# Patient Record
Sex: Female | Born: 1955 | Race: White | Hispanic: No | Smoking: Never smoker
Health system: Southern US, Community
[De-identification: ages and names within clinical notes are randomized; demographics above are authoritative.]

## PROBLEM LIST (undated history)

## (undated) DIAGNOSIS — F419 Anxiety disorder, unspecified: Secondary | ICD-10-CM

## (undated) DIAGNOSIS — N183 Chronic kidney disease, stage 3 unspecified: Secondary | ICD-10-CM

## (undated) DIAGNOSIS — N3281 Overactive bladder: Secondary | ICD-10-CM

## (undated) DIAGNOSIS — E079 Disorder of thyroid, unspecified: Secondary | ICD-10-CM

## (undated) DIAGNOSIS — Z8679 Personal history of other diseases of the circulatory system: Secondary | ICD-10-CM

## (undated) DIAGNOSIS — Z8639 Personal history of other endocrine, nutritional and metabolic disease: Secondary | ICD-10-CM

## (undated) DIAGNOSIS — K219 Gastro-esophageal reflux disease without esophagitis: Secondary | ICD-10-CM

## (undated) DIAGNOSIS — R0602 Shortness of breath: Secondary | ICD-10-CM

## (undated) DIAGNOSIS — Z8669 Personal history of other diseases of the nervous system and sense organs: Secondary | ICD-10-CM

## (undated) DIAGNOSIS — M810 Age-related osteoporosis without current pathological fracture: Secondary | ICD-10-CM

## (undated) DIAGNOSIS — M5030 Other cervical disc degeneration, unspecified cervical region: Secondary | ICD-10-CM

## (undated) DIAGNOSIS — I10 Essential (primary) hypertension: Secondary | ICD-10-CM

## (undated) DIAGNOSIS — M1990 Unspecified osteoarthritis, unspecified site: Secondary | ICD-10-CM

## (undated) DIAGNOSIS — F32A Depression, unspecified: Secondary | ICD-10-CM

## (undated) HISTORY — DX: Shortness of breath: R06.02

## (undated) HISTORY — DX: Anxiety disorder, unspecified: F41.9

## (undated) HISTORY — PX: BOWEL RESECTION: SHX1257

## (undated) HISTORY — PX: SALPINGOOPHORECTOMY: SHX82

## (undated) HISTORY — PX: OTHER SURGICAL HISTORY: SHX169

## (undated) HISTORY — PX: INDUCED ABORTION: SHX677

## (undated) HISTORY — DX: Unspecified osteoarthritis, unspecified site: M19.90

## (undated) HISTORY — PX: WISDOM TOOTH EXTRACTION: SHX21

## (undated) HISTORY — DX: Essential (primary) hypertension: I10

## (undated) HISTORY — DX: Gastro-esophageal reflux disease without esophagitis: K21.9

## (undated) HISTORY — DX: Personal history of other diseases of the nervous system and sense organs: Z86.69

## (undated) HISTORY — PX: TOTAL SHOULDER REPLACEMENT: SUR1217

## (undated) HISTORY — PX: REPLACEMENT TOTAL KNEE: SUR1224

## (undated) HISTORY — DX: Personal history of other endocrine, nutritional and metabolic disease: Z86.39

## (undated) HISTORY — PX: BACK SURGERY: SHX140

## (undated) HISTORY — DX: Age-related osteoporosis without current pathological fracture: M81.0

## (undated) HISTORY — DX: Other cervical disc degeneration, unspecified cervical region: M50.30

## (undated) HISTORY — PX: VAGINAL HYSTERECTOMY: SUR661

## (undated) HISTORY — DX: Chronic kidney disease, stage 3 unspecified: N18.30

## (undated) HISTORY — DX: Personal history of other diseases of the circulatory system: Z86.79

## (undated) HISTORY — PX: CARPAL TUNNEL RELEASE: SHX101

## (undated) HISTORY — DX: Depression, unspecified: F32.A

## (undated) HISTORY — DX: Disorder of thyroid, unspecified: E07.9

## (undated) HISTORY — PX: APPENDECTOMY: SHX54

## (undated) HISTORY — DX: Overactive bladder: N32.81

## (undated) HISTORY — PX: GASTRIC BYPASS: SHX52

---

## 2012-12-14 DIAGNOSIS — E039 Hypothyroidism, unspecified: Secondary | ICD-10-CM | POA: Insufficient documentation

## 2012-12-14 DIAGNOSIS — G4700 Insomnia, unspecified: Secondary | ICD-10-CM | POA: Insufficient documentation

## 2012-12-14 DIAGNOSIS — F32A Depression, unspecified: Secondary | ICD-10-CM | POA: Insufficient documentation

## 2012-12-14 DIAGNOSIS — D508 Other iron deficiency anemias: Secondary | ICD-10-CM | POA: Insufficient documentation

## 2012-12-14 DIAGNOSIS — I10 Essential (primary) hypertension: Secondary | ICD-10-CM | POA: Insufficient documentation

## 2012-12-14 DIAGNOSIS — F3342 Major depressive disorder, recurrent, in full remission: Secondary | ICD-10-CM | POA: Insufficient documentation

## 2014-02-11 DIAGNOSIS — F4310 Post-traumatic stress disorder, unspecified: Secondary | ICD-10-CM | POA: Insufficient documentation

## 2014-04-15 DIAGNOSIS — M1990 Unspecified osteoarthritis, unspecified site: Secondary | ICD-10-CM | POA: Insufficient documentation

## 2014-04-15 DIAGNOSIS — M810 Age-related osteoporosis without current pathological fracture: Secondary | ICD-10-CM | POA: Insufficient documentation

## 2015-04-20 DIAGNOSIS — M5136 Other intervertebral disc degeneration, lumbar region: Secondary | ICD-10-CM | POA: Insufficient documentation

## 2015-06-17 DIAGNOSIS — I10 Essential (primary) hypertension: Secondary | ICD-10-CM | POA: Diagnosis not present

## 2015-06-17 DIAGNOSIS — E039 Hypothyroidism, unspecified: Secondary | ICD-10-CM | POA: Diagnosis not present

## 2015-06-28 DIAGNOSIS — F419 Anxiety disorder, unspecified: Secondary | ICD-10-CM | POA: Diagnosis not present

## 2015-06-28 DIAGNOSIS — R1031 Right lower quadrant pain: Secondary | ICD-10-CM | POA: Diagnosis not present

## 2015-06-28 DIAGNOSIS — K219 Gastro-esophageal reflux disease without esophagitis: Secondary | ICD-10-CM | POA: Diagnosis not present

## 2015-06-28 DIAGNOSIS — F329 Major depressive disorder, single episode, unspecified: Secondary | ICD-10-CM | POA: Diagnosis not present

## 2015-06-28 DIAGNOSIS — I10 Essential (primary) hypertension: Secondary | ICD-10-CM | POA: Diagnosis not present

## 2015-06-28 DIAGNOSIS — R1033 Periumbilical pain: Secondary | ICD-10-CM | POA: Diagnosis not present

## 2015-06-28 DIAGNOSIS — K316 Fistula of stomach and duodenum: Secondary | ICD-10-CM | POA: Diagnosis not present

## 2015-06-28 DIAGNOSIS — Z79899 Other long term (current) drug therapy: Secondary | ICD-10-CM | POA: Diagnosis not present

## 2015-06-28 DIAGNOSIS — R1011 Right upper quadrant pain: Secondary | ICD-10-CM | POA: Diagnosis not present

## 2015-06-28 DIAGNOSIS — R1030 Lower abdominal pain, unspecified: Secondary | ICD-10-CM | POA: Diagnosis not present

## 2015-06-28 DIAGNOSIS — G43909 Migraine, unspecified, not intractable, without status migrainosus: Secondary | ICD-10-CM | POA: Diagnosis not present

## 2015-06-28 DIAGNOSIS — M1990 Unspecified osteoarthritis, unspecified site: Secondary | ICD-10-CM | POA: Diagnosis not present

## 2015-06-29 DIAGNOSIS — R1033 Periumbilical pain: Secondary | ICD-10-CM | POA: Diagnosis not present

## 2015-06-29 DIAGNOSIS — R1031 Right lower quadrant pain: Secondary | ICD-10-CM | POA: Diagnosis not present

## 2015-07-06 DIAGNOSIS — R109 Unspecified abdominal pain: Secondary | ICD-10-CM | POA: Diagnosis not present

## 2015-07-06 DIAGNOSIS — Z9889 Other specified postprocedural states: Secondary | ICD-10-CM | POA: Diagnosis not present

## 2015-07-06 DIAGNOSIS — Z6835 Body mass index (BMI) 35.0-35.9, adult: Secondary | ICD-10-CM | POA: Diagnosis not present

## 2015-07-06 DIAGNOSIS — K316 Fistula of stomach and duodenum: Secondary | ICD-10-CM | POA: Diagnosis not present

## 2015-07-21 DIAGNOSIS — R05 Cough: Secondary | ICD-10-CM | POA: Diagnosis not present

## 2015-07-21 DIAGNOSIS — J329 Chronic sinusitis, unspecified: Secondary | ICD-10-CM | POA: Diagnosis not present

## 2015-07-21 DIAGNOSIS — J029 Acute pharyngitis, unspecified: Secondary | ICD-10-CM | POA: Diagnosis not present

## 2015-07-27 DIAGNOSIS — K9189 Other postprocedural complications and disorders of digestive system: Secondary | ICD-10-CM | POA: Diagnosis not present

## 2015-07-27 DIAGNOSIS — Y832 Surgical operation with anastomosis, bypass or graft as the cause of abnormal reaction of the patient, or of later complication, without mention of misadventure at the time of the procedure: Secondary | ICD-10-CM | POA: Diagnosis not present

## 2015-07-29 ENCOUNTER — Other Ambulatory Visit: Payer: Self-pay | Admitting: Surgery

## 2015-07-29 DIAGNOSIS — K316 Fistula of stomach and duodenum: Secondary | ICD-10-CM

## 2015-08-02 ENCOUNTER — Other Ambulatory Visit: Payer: Self-pay

## 2015-08-04 ENCOUNTER — Other Ambulatory Visit: Payer: Self-pay

## 2015-08-05 ENCOUNTER — Ambulatory Visit
Admission: RE | Admit: 2015-08-05 | Discharge: 2015-08-05 | Disposition: A | Discharge status: Home / Self Care | Payer: Medicare Other | Source: Ambulatory Visit | Attending: Surgery | Admitting: Surgery

## 2015-08-05 ENCOUNTER — Other Ambulatory Visit: Payer: Self-pay | Admitting: Surgery

## 2015-08-05 DIAGNOSIS — K316 Fistula of stomach and duodenum: Secondary | ICD-10-CM

## 2015-08-05 DIAGNOSIS — K449 Diaphragmatic hernia without obstruction or gangrene: Secondary | ICD-10-CM | POA: Diagnosis not present

## 2015-08-17 DIAGNOSIS — E039 Hypothyroidism, unspecified: Secondary | ICD-10-CM | POA: Diagnosis not present

## 2015-08-17 DIAGNOSIS — I10 Essential (primary) hypertension: Secondary | ICD-10-CM | POA: Diagnosis not present

## 2015-08-24 DIAGNOSIS — R7309 Other abnormal glucose: Secondary | ICD-10-CM | POA: Diagnosis not present

## 2015-08-30 DIAGNOSIS — I10 Essential (primary) hypertension: Secondary | ICD-10-CM | POA: Diagnosis not present

## 2015-08-30 DIAGNOSIS — M25572 Pain in left ankle and joints of left foot: Secondary | ICD-10-CM | POA: Diagnosis not present

## 2015-08-30 DIAGNOSIS — Z139 Encounter for screening, unspecified: Secondary | ICD-10-CM | POA: Diagnosis not present

## 2015-08-30 DIAGNOSIS — Z1389 Encounter for screening for other disorder: Secondary | ICD-10-CM | POA: Diagnosis not present

## 2015-08-30 DIAGNOSIS — E039 Hypothyroidism, unspecified: Secondary | ICD-10-CM | POA: Diagnosis not present

## 2015-08-30 DIAGNOSIS — M25579 Pain in unspecified ankle and joints of unspecified foot: Secondary | ICD-10-CM | POA: Diagnosis not present

## 2015-08-30 DIAGNOSIS — K449 Diaphragmatic hernia without obstruction or gangrene: Secondary | ICD-10-CM | POA: Diagnosis not present

## 2015-08-30 DIAGNOSIS — Z0000 Encounter for general adult medical examination without abnormal findings: Secondary | ICD-10-CM | POA: Diagnosis not present

## 2015-09-07 DIAGNOSIS — K449 Diaphragmatic hernia without obstruction or gangrene: Secondary | ICD-10-CM | POA: Diagnosis not present

## 2015-09-07 DIAGNOSIS — R1084 Generalized abdominal pain: Secondary | ICD-10-CM | POA: Diagnosis not present

## 2015-10-13 DIAGNOSIS — M25572 Pain in left ankle and joints of left foot: Secondary | ICD-10-CM | POA: Diagnosis not present

## 2015-10-13 DIAGNOSIS — R262 Difficulty in walking, not elsewhere classified: Secondary | ICD-10-CM | POA: Diagnosis not present

## 2015-10-13 DIAGNOSIS — M6281 Muscle weakness (generalized): Secondary | ICD-10-CM | POA: Diagnosis not present

## 2015-10-13 DIAGNOSIS — I10 Essential (primary) hypertension: Secondary | ICD-10-CM | POA: Diagnosis not present

## 2016-01-20 DIAGNOSIS — M79672 Pain in left foot: Secondary | ICD-10-CM | POA: Diagnosis not present

## 2016-02-16 DIAGNOSIS — G8929 Other chronic pain: Secondary | ICD-10-CM | POA: Diagnosis not present

## 2016-02-16 DIAGNOSIS — Z1389 Encounter for screening for other disorder: Secondary | ICD-10-CM | POA: Diagnosis not present

## 2016-02-16 DIAGNOSIS — Z13220 Encounter for screening for lipoid disorders: Secondary | ICD-10-CM | POA: Diagnosis not present

## 2016-02-16 DIAGNOSIS — M549 Dorsalgia, unspecified: Secondary | ICD-10-CM | POA: Diagnosis not present

## 2016-02-16 DIAGNOSIS — E039 Hypothyroidism, unspecified: Secondary | ICD-10-CM | POA: Diagnosis not present

## 2016-02-16 DIAGNOSIS — I10 Essential (primary) hypertension: Secondary | ICD-10-CM | POA: Diagnosis not present

## 2016-02-16 DIAGNOSIS — R7301 Impaired fasting glucose: Secondary | ICD-10-CM | POA: Diagnosis not present

## 2016-03-21 DIAGNOSIS — M25571 Pain in right ankle and joints of right foot: Secondary | ICD-10-CM | POA: Diagnosis not present

## 2016-04-17 DIAGNOSIS — M1288 Other specific arthropathies, not elsewhere classified, other specified site: Secondary | ICD-10-CM | POA: Diagnosis not present

## 2016-04-17 DIAGNOSIS — M4316 Spondylolisthesis, lumbar region: Secondary | ICD-10-CM | POA: Diagnosis not present

## 2016-04-17 DIAGNOSIS — M5136 Other intervertebral disc degeneration, lumbar region: Secondary | ICD-10-CM | POA: Diagnosis not present

## 2016-04-17 DIAGNOSIS — M5416 Radiculopathy, lumbar region: Secondary | ICD-10-CM | POA: Diagnosis not present

## 2016-04-24 DIAGNOSIS — M545 Low back pain: Secondary | ICD-10-CM | POA: Diagnosis not present

## 2016-04-24 DIAGNOSIS — M4316 Spondylolisthesis, lumbar region: Secondary | ICD-10-CM | POA: Diagnosis not present

## 2016-04-24 DIAGNOSIS — M5416 Radiculopathy, lumbar region: Secondary | ICD-10-CM | POA: Diagnosis not present

## 2016-04-24 DIAGNOSIS — M5126 Other intervertebral disc displacement, lumbar region: Secondary | ICD-10-CM | POA: Diagnosis not present

## 2016-04-24 DIAGNOSIS — M5136 Other intervertebral disc degeneration, lumbar region: Secondary | ICD-10-CM | POA: Diagnosis not present

## 2016-05-01 DIAGNOSIS — M5416 Radiculopathy, lumbar region: Secondary | ICD-10-CM | POA: Diagnosis not present

## 2016-05-01 DIAGNOSIS — M5136 Other intervertebral disc degeneration, lumbar region: Secondary | ICD-10-CM | POA: Diagnosis not present

## 2016-05-01 DIAGNOSIS — M9983 Other biomechanical lesions of lumbar region: Secondary | ICD-10-CM | POA: Diagnosis not present

## 2016-05-01 DIAGNOSIS — M1288 Other specific arthropathies, not elsewhere classified, other specified site: Secondary | ICD-10-CM | POA: Diagnosis not present

## 2016-05-22 DIAGNOSIS — R292 Abnormal reflex: Secondary | ICD-10-CM | POA: Diagnosis not present

## 2016-05-22 DIAGNOSIS — M545 Low back pain: Secondary | ICD-10-CM | POA: Diagnosis not present

## 2016-05-22 DIAGNOSIS — M47816 Spondylosis without myelopathy or radiculopathy, lumbar region: Secondary | ICD-10-CM | POA: Diagnosis not present

## 2016-05-22 DIAGNOSIS — G8929 Other chronic pain: Secondary | ICD-10-CM | POA: Diagnosis not present

## 2016-05-29 DIAGNOSIS — M50221 Other cervical disc displacement at C4-C5 level: Secondary | ICD-10-CM | POA: Diagnosis not present

## 2016-05-29 DIAGNOSIS — M50222 Other cervical disc displacement at C5-C6 level: Secondary | ICD-10-CM | POA: Diagnosis not present

## 2016-05-29 DIAGNOSIS — M50223 Other cervical disc displacement at C6-C7 level: Secondary | ICD-10-CM | POA: Diagnosis not present

## 2016-05-29 DIAGNOSIS — M5030 Other cervical disc degeneration, unspecified cervical region: Secondary | ICD-10-CM | POA: Diagnosis not present

## 2016-06-05 DIAGNOSIS — M545 Low back pain: Secondary | ICD-10-CM | POA: Diagnosis not present

## 2016-06-05 DIAGNOSIS — M9983 Other biomechanical lesions of lumbar region: Secondary | ICD-10-CM | POA: Diagnosis not present

## 2016-06-05 DIAGNOSIS — G8929 Other chronic pain: Secondary | ICD-10-CM | POA: Diagnosis not present

## 2016-06-05 DIAGNOSIS — M5136 Other intervertebral disc degeneration, lumbar region: Secondary | ICD-10-CM | POA: Diagnosis not present

## 2016-06-05 DIAGNOSIS — M5416 Radiculopathy, lumbar region: Secondary | ICD-10-CM | POA: Diagnosis not present

## 2016-06-19 DIAGNOSIS — G8929 Other chronic pain: Secondary | ICD-10-CM | POA: Diagnosis not present

## 2016-06-19 DIAGNOSIS — M549 Dorsalgia, unspecified: Secondary | ICD-10-CM | POA: Diagnosis not present

## 2016-06-19 DIAGNOSIS — M1288 Other specific arthropathies, not elsewhere classified, other specified site: Secondary | ICD-10-CM | POA: Diagnosis not present

## 2016-07-11 DIAGNOSIS — M4316 Spondylolisthesis, lumbar region: Secondary | ICD-10-CM | POA: Diagnosis not present

## 2016-08-22 DIAGNOSIS — M170 Bilateral primary osteoarthritis of knee: Secondary | ICD-10-CM | POA: Diagnosis not present

## 2016-08-30 DIAGNOSIS — G894 Chronic pain syndrome: Secondary | ICD-10-CM | POA: Diagnosis not present

## 2016-08-30 DIAGNOSIS — M179 Osteoarthritis of knee, unspecified: Secondary | ICD-10-CM | POA: Diagnosis not present

## 2016-08-30 DIAGNOSIS — G8929 Other chronic pain: Secondary | ICD-10-CM | POA: Diagnosis not present

## 2016-08-30 DIAGNOSIS — M5416 Radiculopathy, lumbar region: Secondary | ICD-10-CM | POA: Diagnosis not present

## 2016-08-30 DIAGNOSIS — M5440 Lumbago with sciatica, unspecified side: Secondary | ICD-10-CM | POA: Diagnosis not present

## 2016-09-04 DIAGNOSIS — M170 Bilateral primary osteoarthritis of knee: Secondary | ICD-10-CM | POA: Diagnosis not present

## 2016-09-14 DIAGNOSIS — E039 Hypothyroidism, unspecified: Secondary | ICD-10-CM | POA: Diagnosis not present

## 2016-09-14 DIAGNOSIS — Z13220 Encounter for screening for lipoid disorders: Secondary | ICD-10-CM | POA: Diagnosis not present

## 2016-09-14 DIAGNOSIS — Z0000 Encounter for general adult medical examination without abnormal findings: Secondary | ICD-10-CM | POA: Diagnosis not present

## 2016-09-14 DIAGNOSIS — R7303 Prediabetes: Secondary | ICD-10-CM | POA: Diagnosis not present

## 2016-09-14 DIAGNOSIS — Z139 Encounter for screening, unspecified: Secondary | ICD-10-CM | POA: Diagnosis not present

## 2016-09-14 DIAGNOSIS — Z1389 Encounter for screening for other disorder: Secondary | ICD-10-CM | POA: Diagnosis not present

## 2016-09-14 DIAGNOSIS — I10 Essential (primary) hypertension: Secondary | ICD-10-CM | POA: Diagnosis not present

## 2016-09-14 DIAGNOSIS — E538 Deficiency of other specified B group vitamins: Secondary | ICD-10-CM | POA: Diagnosis not present

## 2016-09-27 DIAGNOSIS — M5440 Lumbago with sciatica, unspecified side: Secondary | ICD-10-CM | POA: Diagnosis not present

## 2016-09-27 DIAGNOSIS — G8929 Other chronic pain: Secondary | ICD-10-CM | POA: Diagnosis not present

## 2016-09-27 DIAGNOSIS — G894 Chronic pain syndrome: Secondary | ICD-10-CM | POA: Diagnosis not present

## 2016-09-27 DIAGNOSIS — R1031 Right lower quadrant pain: Secondary | ICD-10-CM | POA: Diagnosis not present

## 2016-09-27 DIAGNOSIS — R109 Unspecified abdominal pain: Secondary | ICD-10-CM | POA: Diagnosis not present

## 2016-09-27 DIAGNOSIS — M5416 Radiculopathy, lumbar region: Secondary | ICD-10-CM | POA: Diagnosis not present

## 2016-09-27 DIAGNOSIS — M179 Osteoarthritis of knee, unspecified: Secondary | ICD-10-CM | POA: Diagnosis not present

## 2016-09-28 DIAGNOSIS — R109 Unspecified abdominal pain: Secondary | ICD-10-CM | POA: Diagnosis not present

## 2016-10-02 DIAGNOSIS — Z9189 Other specified personal risk factors, not elsewhere classified: Secondary | ICD-10-CM | POA: Diagnosis not present

## 2016-10-09 DIAGNOSIS — M810 Age-related osteoporosis without current pathological fracture: Secondary | ICD-10-CM | POA: Diagnosis not present

## 2016-10-09 DIAGNOSIS — Z1231 Encounter for screening mammogram for malignant neoplasm of breast: Secondary | ICD-10-CM | POA: Diagnosis not present

## 2016-10-18 DIAGNOSIS — M545 Low back pain: Secondary | ICD-10-CM | POA: Diagnosis not present

## 2016-10-18 DIAGNOSIS — M791 Myalgia: Secondary | ICD-10-CM | POA: Diagnosis not present

## 2016-10-18 DIAGNOSIS — M179 Osteoarthritis of knee, unspecified: Secondary | ICD-10-CM | POA: Diagnosis not present

## 2016-10-18 DIAGNOSIS — G8929 Other chronic pain: Secondary | ICD-10-CM | POA: Diagnosis not present

## 2016-10-18 DIAGNOSIS — M5440 Lumbago with sciatica, unspecified side: Secondary | ICD-10-CM | POA: Diagnosis not present

## 2016-10-18 DIAGNOSIS — M5416 Radiculopathy, lumbar region: Secondary | ICD-10-CM | POA: Diagnosis not present

## 2016-10-18 DIAGNOSIS — G894 Chronic pain syndrome: Secondary | ICD-10-CM | POA: Diagnosis not present

## 2016-10-23 DIAGNOSIS — M170 Bilateral primary osteoarthritis of knee: Secondary | ICD-10-CM | POA: Diagnosis not present

## 2016-10-25 DIAGNOSIS — M5440 Lumbago with sciatica, unspecified side: Secondary | ICD-10-CM | POA: Diagnosis not present

## 2016-10-25 DIAGNOSIS — M179 Osteoarthritis of knee, unspecified: Secondary | ICD-10-CM | POA: Diagnosis not present

## 2016-10-25 DIAGNOSIS — M545 Low back pain: Secondary | ICD-10-CM | POA: Diagnosis not present

## 2016-10-25 DIAGNOSIS — M5416 Radiculopathy, lumbar region: Secondary | ICD-10-CM | POA: Diagnosis not present

## 2016-10-25 DIAGNOSIS — G8929 Other chronic pain: Secondary | ICD-10-CM | POA: Diagnosis not present

## 2016-11-01 DIAGNOSIS — M5416 Radiculopathy, lumbar region: Secondary | ICD-10-CM | POA: Diagnosis not present

## 2016-11-01 DIAGNOSIS — M179 Osteoarthritis of knee, unspecified: Secondary | ICD-10-CM | POA: Diagnosis not present

## 2016-11-01 DIAGNOSIS — G894 Chronic pain syndrome: Secondary | ICD-10-CM | POA: Diagnosis not present

## 2016-11-01 DIAGNOSIS — G8929 Other chronic pain: Secondary | ICD-10-CM | POA: Diagnosis not present

## 2016-11-01 DIAGNOSIS — M5440 Lumbago with sciatica, unspecified side: Secondary | ICD-10-CM | POA: Diagnosis not present

## 2016-11-28 DIAGNOSIS — M810 Age-related osteoporosis without current pathological fracture: Secondary | ICD-10-CM | POA: Diagnosis not present

## 2016-11-28 DIAGNOSIS — Z0000 Encounter for general adult medical examination without abnormal findings: Secondary | ICD-10-CM | POA: Diagnosis not present

## 2016-11-28 DIAGNOSIS — M170 Bilateral primary osteoarthritis of knee: Secondary | ICD-10-CM | POA: Diagnosis not present

## 2016-11-29 DIAGNOSIS — M79609 Pain in unspecified limb: Secondary | ICD-10-CM | POA: Diagnosis not present

## 2016-11-29 DIAGNOSIS — Z01810 Encounter for preprocedural cardiovascular examination: Secondary | ICD-10-CM | POA: Diagnosis not present

## 2016-11-29 DIAGNOSIS — Z01812 Encounter for preprocedural laboratory examination: Secondary | ICD-10-CM | POA: Diagnosis not present

## 2016-11-29 DIAGNOSIS — Z79899 Other long term (current) drug therapy: Secondary | ICD-10-CM | POA: Diagnosis not present

## 2016-11-29 DIAGNOSIS — R52 Pain, unspecified: Secondary | ICD-10-CM | POA: Diagnosis not present

## 2016-11-29 DIAGNOSIS — Z01818 Encounter for other preprocedural examination: Secondary | ICD-10-CM | POA: Diagnosis not present

## 2016-12-18 DIAGNOSIS — S8392XA Sprain of unspecified site of left knee, initial encounter: Secondary | ICD-10-CM | POA: Diagnosis not present

## 2016-12-18 DIAGNOSIS — N3289 Other specified disorders of bladder: Secondary | ICD-10-CM | POA: Diagnosis not present

## 2016-12-18 DIAGNOSIS — N3281 Overactive bladder: Secondary | ICD-10-CM | POA: Diagnosis not present

## 2016-12-25 DIAGNOSIS — M25572 Pain in left ankle and joints of left foot: Secondary | ICD-10-CM | POA: Diagnosis not present

## 2016-12-25 DIAGNOSIS — S8392XA Sprain of unspecified site of left knee, initial encounter: Secondary | ICD-10-CM | POA: Diagnosis not present

## 2016-12-25 DIAGNOSIS — M25562 Pain in left knee: Secondary | ICD-10-CM | POA: Diagnosis not present

## 2017-01-11 DIAGNOSIS — Z01818 Encounter for other preprocedural examination: Secondary | ICD-10-CM | POA: Diagnosis not present

## 2017-01-11 DIAGNOSIS — Z79899 Other long term (current) drug therapy: Secondary | ICD-10-CM | POA: Diagnosis not present

## 2017-01-11 DIAGNOSIS — R52 Pain, unspecified: Secondary | ICD-10-CM | POA: Diagnosis not present

## 2017-01-11 DIAGNOSIS — M79609 Pain in unspecified limb: Secondary | ICD-10-CM | POA: Diagnosis not present

## 2017-01-15 DIAGNOSIS — G2581 Restless legs syndrome: Secondary | ICD-10-CM | POA: Diagnosis not present

## 2017-01-15 DIAGNOSIS — Z139 Encounter for screening, unspecified: Secondary | ICD-10-CM | POA: Diagnosis not present

## 2017-01-15 DIAGNOSIS — M5416 Radiculopathy, lumbar region: Secondary | ICD-10-CM | POA: Diagnosis not present

## 2017-01-15 DIAGNOSIS — Z1389 Encounter for screening for other disorder: Secondary | ICD-10-CM | POA: Diagnosis not present

## 2017-01-30 DIAGNOSIS — M5442 Lumbago with sciatica, left side: Secondary | ICD-10-CM | POA: Diagnosis not present

## 2017-01-30 DIAGNOSIS — M5441 Lumbago with sciatica, right side: Secondary | ICD-10-CM | POA: Diagnosis not present

## 2017-01-30 DIAGNOSIS — G629 Polyneuropathy, unspecified: Secondary | ICD-10-CM | POA: Diagnosis not present

## 2017-01-30 DIAGNOSIS — M1990 Unspecified osteoarthritis, unspecified site: Secondary | ICD-10-CM | POA: Diagnosis not present

## 2017-02-08 DIAGNOSIS — Z01818 Encounter for other preprocedural examination: Secondary | ICD-10-CM | POA: Diagnosis not present

## 2017-02-08 DIAGNOSIS — M79609 Pain in unspecified limb: Secondary | ICD-10-CM | POA: Diagnosis not present

## 2017-03-06 DIAGNOSIS — Z01818 Encounter for other preprocedural examination: Secondary | ICD-10-CM | POA: Diagnosis not present

## 2017-03-06 DIAGNOSIS — M1711 Unilateral primary osteoarthritis, right knee: Secondary | ICD-10-CM | POA: Diagnosis not present

## 2017-03-07 DIAGNOSIS — M1711 Unilateral primary osteoarthritis, right knee: Secondary | ICD-10-CM | POA: Diagnosis not present

## 2017-03-14 DIAGNOSIS — Z79899 Other long term (current) drug therapy: Secondary | ICD-10-CM | POA: Diagnosis not present

## 2017-03-14 DIAGNOSIS — Z881 Allergy status to other antibiotic agents status: Secondary | ICD-10-CM | POA: Diagnosis not present

## 2017-03-14 DIAGNOSIS — E782 Mixed hyperlipidemia: Secondary | ICD-10-CM | POA: Diagnosis not present

## 2017-03-14 DIAGNOSIS — Z471 Aftercare following joint replacement surgery: Secondary | ICD-10-CM | POA: Diagnosis not present

## 2017-03-14 DIAGNOSIS — G2581 Restless legs syndrome: Secondary | ICD-10-CM | POA: Diagnosis not present

## 2017-03-14 DIAGNOSIS — I509 Heart failure, unspecified: Secondary | ICD-10-CM | POA: Diagnosis not present

## 2017-03-14 DIAGNOSIS — G8918 Other acute postprocedural pain: Secondary | ICD-10-CM | POA: Diagnosis not present

## 2017-03-14 DIAGNOSIS — Z96651 Presence of right artificial knee joint: Secondary | ICD-10-CM | POA: Diagnosis not present

## 2017-03-14 DIAGNOSIS — D509 Iron deficiency anemia, unspecified: Secondary | ICD-10-CM | POA: Diagnosis not present

## 2017-03-14 DIAGNOSIS — N3281 Overactive bladder: Secondary | ICD-10-CM | POA: Diagnosis not present

## 2017-03-14 DIAGNOSIS — I110 Hypertensive heart disease with heart failure: Secondary | ICD-10-CM | POA: Diagnosis not present

## 2017-03-14 DIAGNOSIS — M1711 Unilateral primary osteoarthritis, right knee: Secondary | ICD-10-CM | POA: Diagnosis not present

## 2017-03-14 DIAGNOSIS — K219 Gastro-esophageal reflux disease without esophagitis: Secondary | ICD-10-CM | POA: Diagnosis not present

## 2017-03-17 DIAGNOSIS — Z471 Aftercare following joint replacement surgery: Secondary | ICD-10-CM | POA: Diagnosis not present

## 2017-03-17 DIAGNOSIS — Z79891 Long term (current) use of opiate analgesic: Secondary | ICD-10-CM | POA: Diagnosis not present

## 2017-03-17 DIAGNOSIS — Z96651 Presence of right artificial knee joint: Secondary | ICD-10-CM | POA: Diagnosis not present

## 2017-03-17 DIAGNOSIS — Z7982 Long term (current) use of aspirin: Secondary | ICD-10-CM | POA: Diagnosis not present

## 2017-03-18 DIAGNOSIS — Z7982 Long term (current) use of aspirin: Secondary | ICD-10-CM | POA: Diagnosis not present

## 2017-03-18 DIAGNOSIS — Z471 Aftercare following joint replacement surgery: Secondary | ICD-10-CM | POA: Diagnosis not present

## 2017-03-18 DIAGNOSIS — Z79891 Long term (current) use of opiate analgesic: Secondary | ICD-10-CM | POA: Diagnosis not present

## 2017-03-18 DIAGNOSIS — Z96651 Presence of right artificial knee joint: Secondary | ICD-10-CM | POA: Diagnosis not present

## 2017-03-19 DIAGNOSIS — Z96651 Presence of right artificial knee joint: Secondary | ICD-10-CM | POA: Diagnosis not present

## 2017-03-19 DIAGNOSIS — Z471 Aftercare following joint replacement surgery: Secondary | ICD-10-CM | POA: Diagnosis not present

## 2017-03-19 DIAGNOSIS — Z79891 Long term (current) use of opiate analgesic: Secondary | ICD-10-CM | POA: Diagnosis not present

## 2017-03-19 DIAGNOSIS — Z7982 Long term (current) use of aspirin: Secondary | ICD-10-CM | POA: Diagnosis not present

## 2017-03-20 DIAGNOSIS — Z79891 Long term (current) use of opiate analgesic: Secondary | ICD-10-CM | POA: Diagnosis not present

## 2017-03-20 DIAGNOSIS — Z7982 Long term (current) use of aspirin: Secondary | ICD-10-CM | POA: Diagnosis not present

## 2017-03-20 DIAGNOSIS — Z96651 Presence of right artificial knee joint: Secondary | ICD-10-CM | POA: Diagnosis not present

## 2017-03-20 DIAGNOSIS — Z471 Aftercare following joint replacement surgery: Secondary | ICD-10-CM | POA: Diagnosis not present

## 2017-03-21 DIAGNOSIS — Z79891 Long term (current) use of opiate analgesic: Secondary | ICD-10-CM | POA: Diagnosis not present

## 2017-03-21 DIAGNOSIS — Z96651 Presence of right artificial knee joint: Secondary | ICD-10-CM | POA: Diagnosis not present

## 2017-03-21 DIAGNOSIS — Z7982 Long term (current) use of aspirin: Secondary | ICD-10-CM | POA: Diagnosis not present

## 2017-03-21 DIAGNOSIS — Z471 Aftercare following joint replacement surgery: Secondary | ICD-10-CM | POA: Diagnosis not present

## 2017-03-22 DIAGNOSIS — Z79891 Long term (current) use of opiate analgesic: Secondary | ICD-10-CM | POA: Diagnosis not present

## 2017-03-22 DIAGNOSIS — Z471 Aftercare following joint replacement surgery: Secondary | ICD-10-CM | POA: Diagnosis not present

## 2017-03-22 DIAGNOSIS — Z7982 Long term (current) use of aspirin: Secondary | ICD-10-CM | POA: Diagnosis not present

## 2017-03-22 DIAGNOSIS — Z96651 Presence of right artificial knee joint: Secondary | ICD-10-CM | POA: Diagnosis not present

## 2017-03-26 DIAGNOSIS — M7989 Other specified soft tissue disorders: Secondary | ICD-10-CM | POA: Diagnosis not present

## 2017-03-26 DIAGNOSIS — Z7982 Long term (current) use of aspirin: Secondary | ICD-10-CM | POA: Diagnosis not present

## 2017-03-26 DIAGNOSIS — Z471 Aftercare following joint replacement surgery: Secondary | ICD-10-CM | POA: Diagnosis not present

## 2017-03-26 DIAGNOSIS — Z96651 Presence of right artificial knee joint: Secondary | ICD-10-CM | POA: Diagnosis not present

## 2017-03-26 DIAGNOSIS — M79661 Pain in right lower leg: Secondary | ICD-10-CM | POA: Diagnosis not present

## 2017-03-26 DIAGNOSIS — Z79891 Long term (current) use of opiate analgesic: Secondary | ICD-10-CM | POA: Diagnosis not present

## 2017-03-29 DIAGNOSIS — R2689 Other abnormalities of gait and mobility: Secondary | ICD-10-CM | POA: Diagnosis not present

## 2017-03-29 DIAGNOSIS — M6281 Muscle weakness (generalized): Secondary | ICD-10-CM | POA: Diagnosis not present

## 2017-03-29 DIAGNOSIS — M1711 Unilateral primary osteoarthritis, right knee: Secondary | ICD-10-CM | POA: Diagnosis not present

## 2017-03-29 DIAGNOSIS — M25561 Pain in right knee: Secondary | ICD-10-CM | POA: Diagnosis not present

## 2017-03-29 DIAGNOSIS — M25661 Stiffness of right knee, not elsewhere classified: Secondary | ICD-10-CM | POA: Diagnosis not present

## 2017-04-24 DIAGNOSIS — Z96651 Presence of right artificial knee joint: Secondary | ICD-10-CM | POA: Diagnosis not present

## 2017-05-22 DIAGNOSIS — M1990 Unspecified osteoarthritis, unspecified site: Secondary | ICD-10-CM | POA: Diagnosis not present

## 2017-05-22 DIAGNOSIS — G629 Polyneuropathy, unspecified: Secondary | ICD-10-CM | POA: Diagnosis not present

## 2017-06-15 IMAGING — RF DG UGI W/ KUB
19 of 24 series · 19 of 24 positions shown · non-contrast
Comparison: Abdomen CT dated 06/29/2015.

CLINICAL DATA: Oral contrast thought to be within the excluded
portion of the stomach on a recent abdomen CT. This was felt to be
suspicious for a gastrogastric fistula. Status post gastric bypass
in 9990. The patient reports that she had a large hiatal hernia
prior to the bypass.

EXAM:
UPPER GI SERIES WITH KUB
TECHNIQUE: After obtaining a scout radiograph a routine upper GI series was
performed using thin density barium.
FLUOROSCOPY TIME:  Radiation Exposure Index (as provided by the
fluoroscopic device): 169 dGy/cm2
If the device does not provide the exposure index:
Fluoroscopy Time (in minutes and seconds):  3 minutes and 54 seconds
Number of Acquired Images:  28

[Series 1: run · 1 of 1 slices shown (1 of 19)]
[im 1/1]
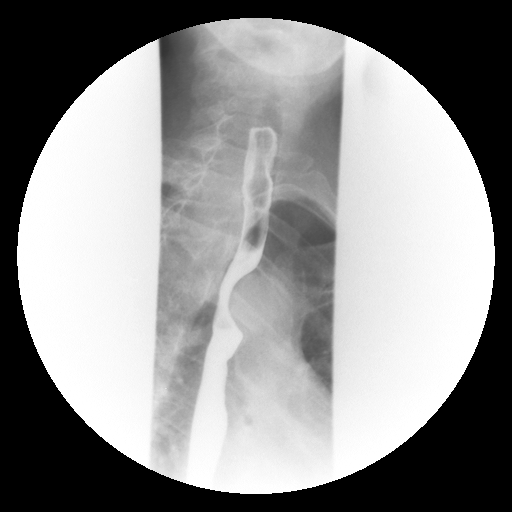

[Series 2: run · 1 of 1 slices shown (2 of 19)]
[im 1/1]
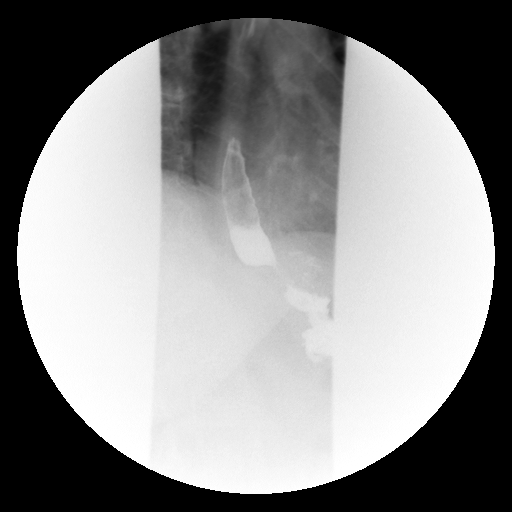

[Series 4: run · 1 of 1 slices shown (3 of 19)]
[im 1/1]
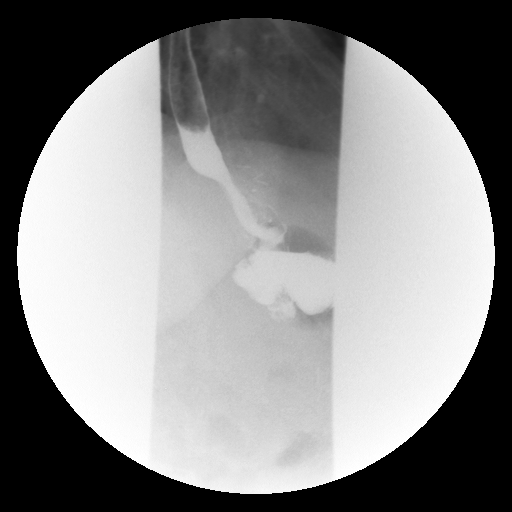

[Series 5: run · 1 of 1 slices shown (4 of 19)]
[im 1/1]
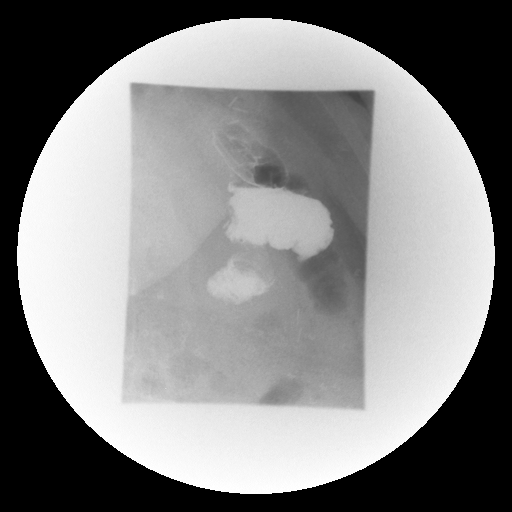

[Series 6: run · 1 of 1 slices shown (5 of 19)]
[im 1/1]
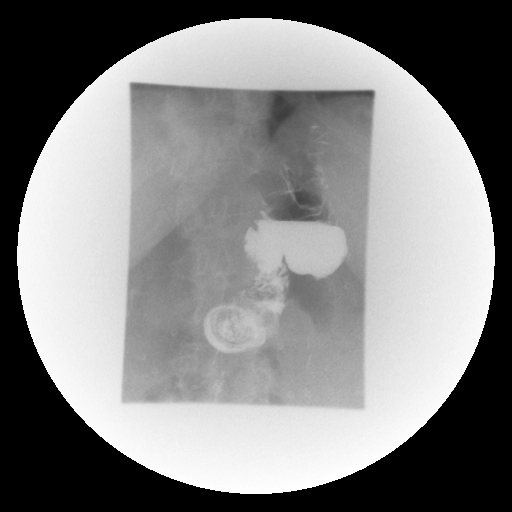

[Series 7: run · 1 of 1 slices shown (6 of 19)]
[im 1/1]
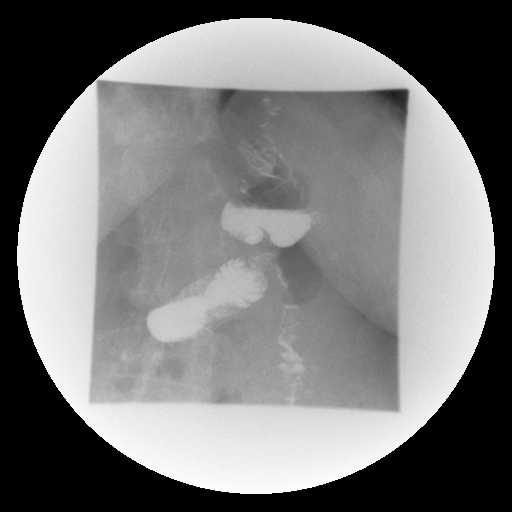

[Series 9: run · 1 of 1 slices shown (7 of 19)]
[im 1/1]
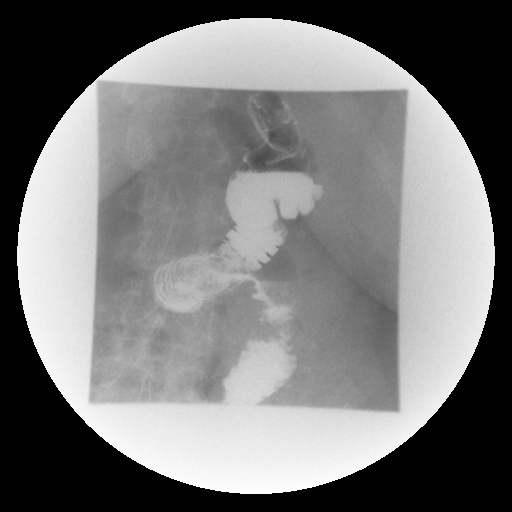

[Series 10: run · 1 of 1 slices shown (8 of 19)]
[im 1/1]
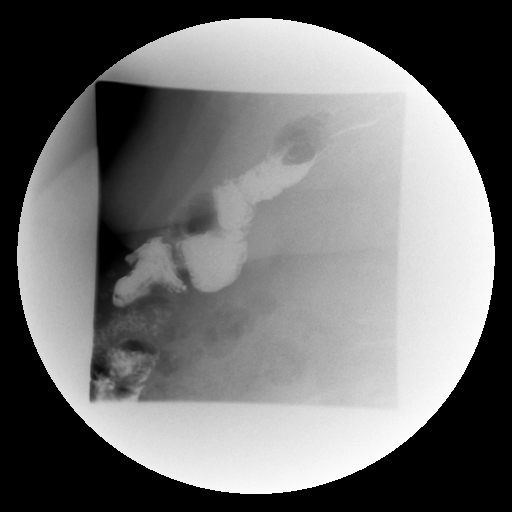

[Series 11: run · 1 of 1 slices shown (9 of 19)]
[im 1/1]
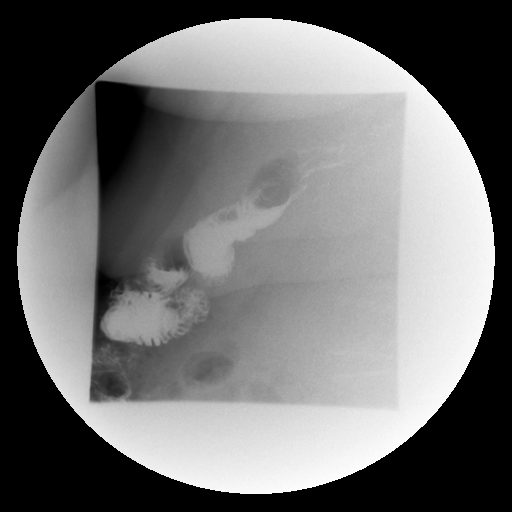

[Series 13: run · 1 of 1 slices shown (10 of 19)]
[im 1/1]
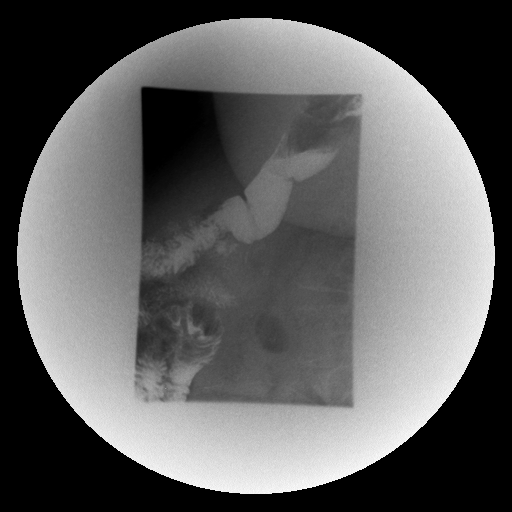

[Series 14: run · 1 of 1 slices shown (11 of 19)]
[im 1/1]
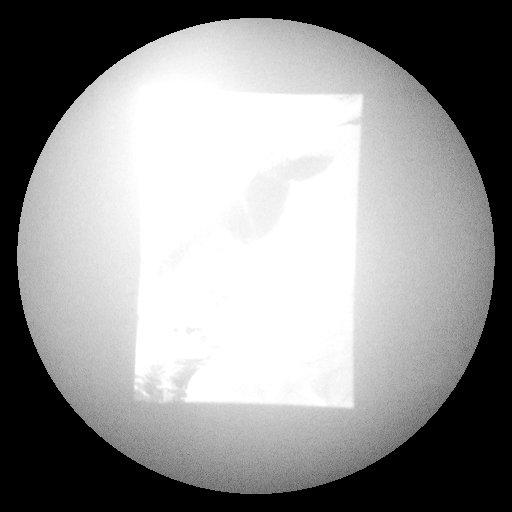

[Series 15: run · 1 of 1 slices shown (12 of 19)]
[im 1/1]
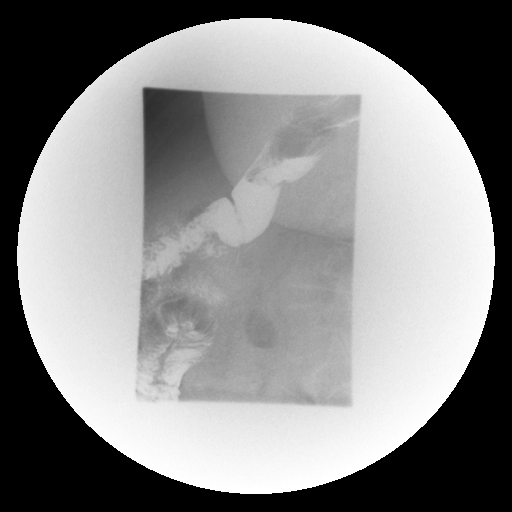

[Series 16: run · 1 of 1 slices shown (13 of 19)]
[im 1/1]
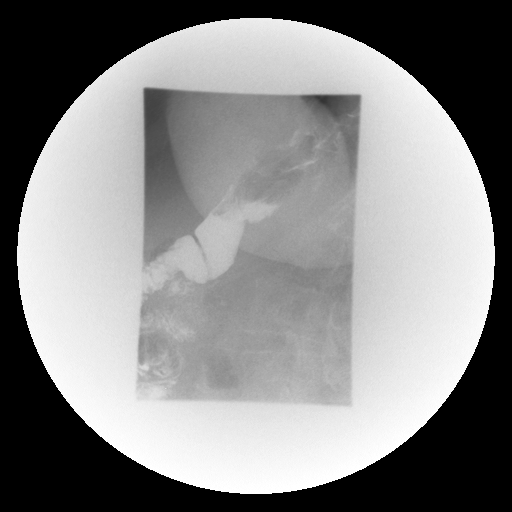

[Series 18: run · 1 of 1 slices shown (14 of 19)]
[im 1/1]
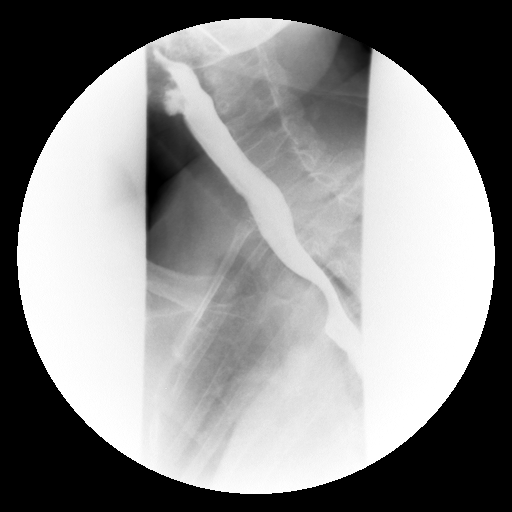

[Series 19: run · 1 of 1 slices shown (15 of 19)]
[im 1/1]
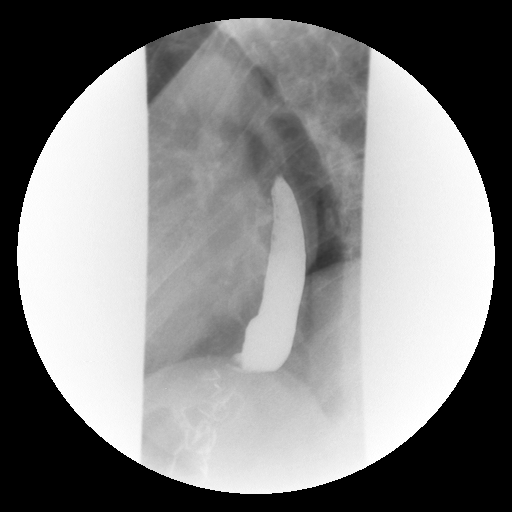

[Series 20: run · 1 of 1 slices shown (16 of 19)]
[im 1/1]
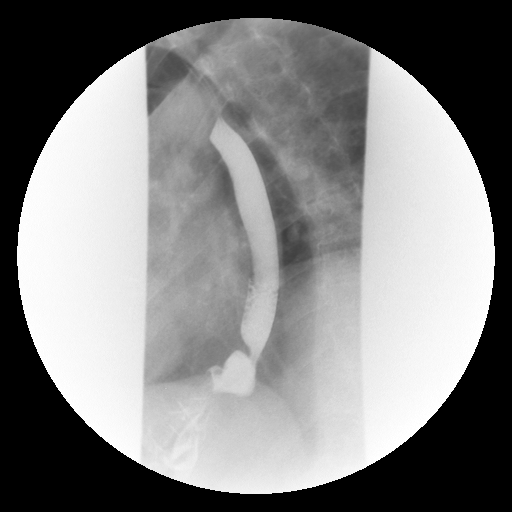

[Series 21: run · 1 of 1 slices shown (17 of 19)]
[im 1/1]
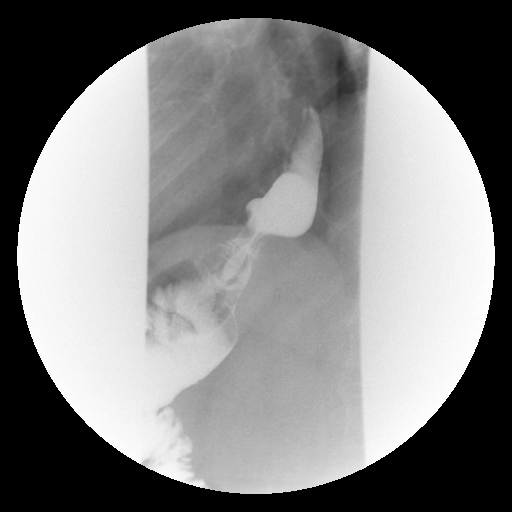

[Series 23: run · 1 of 1 slices shown (18 of 19)]
[im 1/1]
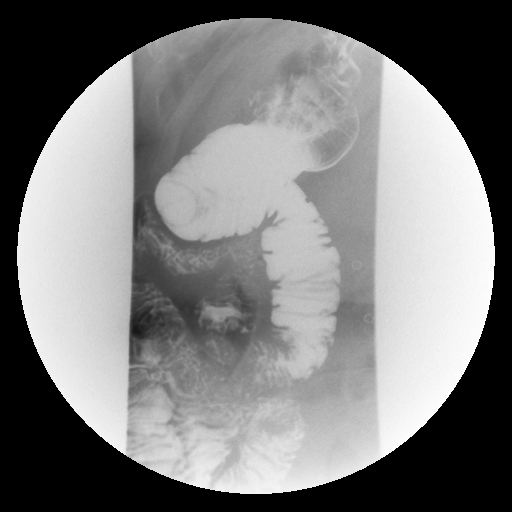

[Series 24: run · 1 of 1 slices shown (19 of 19)]
[im 1/1]
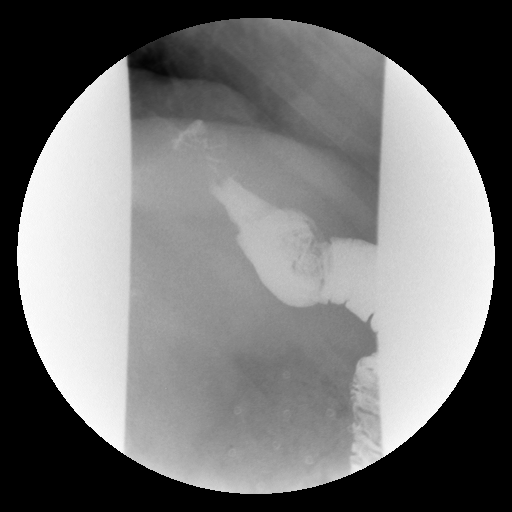

[19 of 24 positions shown; findings below may reference images not displayed]

FINDINGS: The patient swallowed barium without difficulty. Normal esophageal
peristalsis with no hypopharyngeal abnormalities seen. The esophagus
has a normal appearance. A small sliding hiatal hernia was
demonstrated with no gastroesophageal reflux seen during the
examination.

Also demonstrated are changes of a previous gastric bypass with free
flow of barium through the anastomosis into the proximal small
bowel. No barium was seen extending into the excluded portion of the
stomach. The included portion of the stomach has a normal appearance
other than the small sliding hiatal hernia.
IMPRESSION: 1. Small sliding hiatal hernia.
2. Otherwise, normal examination, status post gastric bypass.
Specifically, no gastrogastric fistula was seen. No barium entered
the excluded stomach. Therefore, the recently suspected small amount
of oral contrast in the excluded stomach most likely represented
inspissated secretions instead of oral contrast.

## 2017-08-02 DIAGNOSIS — M549 Dorsalgia, unspecified: Secondary | ICD-10-CM | POA: Diagnosis not present

## 2017-08-17 DIAGNOSIS — M549 Dorsalgia, unspecified: Secondary | ICD-10-CM | POA: Diagnosis not present

## 2017-08-17 DIAGNOSIS — M545 Low back pain: Secondary | ICD-10-CM | POA: Diagnosis not present

## 2017-08-23 DIAGNOSIS — M549 Dorsalgia, unspecified: Secondary | ICD-10-CM | POA: Diagnosis not present

## 2017-08-23 DIAGNOSIS — G8929 Other chronic pain: Secondary | ICD-10-CM | POA: Diagnosis not present

## 2017-09-10 DIAGNOSIS — R05 Cough: Secondary | ICD-10-CM | POA: Diagnosis not present

## 2017-09-13 DIAGNOSIS — S60032A Contusion of left middle finger without damage to nail, initial encounter: Secondary | ICD-10-CM | POA: Diagnosis not present

## 2017-09-13 DIAGNOSIS — J209 Acute bronchitis, unspecified: Secondary | ICD-10-CM | POA: Diagnosis not present

## 2017-11-13 DIAGNOSIS — Z79891 Long term (current) use of opiate analgesic: Secondary | ICD-10-CM | POA: Diagnosis not present

## 2017-11-13 DIAGNOSIS — M5416 Radiculopathy, lumbar region: Secondary | ICD-10-CM | POA: Diagnosis not present

## 2017-11-13 DIAGNOSIS — M5136 Other intervertebral disc degeneration, lumbar region: Secondary | ICD-10-CM | POA: Diagnosis not present

## 2017-11-13 DIAGNOSIS — M4316 Spondylolisthesis, lumbar region: Secondary | ICD-10-CM | POA: Diagnosis not present

## 2017-11-13 DIAGNOSIS — G8929 Other chronic pain: Secondary | ICD-10-CM | POA: Diagnosis not present

## 2017-11-29 DIAGNOSIS — R05 Cough: Secondary | ICD-10-CM | POA: Diagnosis not present

## 2017-12-03 DIAGNOSIS — J209 Acute bronchitis, unspecified: Secondary | ICD-10-CM | POA: Diagnosis not present

## 2017-12-18 DIAGNOSIS — M5136 Other intervertebral disc degeneration, lumbar region: Secondary | ICD-10-CM | POA: Diagnosis not present

## 2017-12-18 DIAGNOSIS — M5416 Radiculopathy, lumbar region: Secondary | ICD-10-CM | POA: Diagnosis not present

## 2017-12-18 DIAGNOSIS — M4316 Spondylolisthesis, lumbar region: Secondary | ICD-10-CM | POA: Diagnosis not present

## 2017-12-18 DIAGNOSIS — G8929 Other chronic pain: Secondary | ICD-10-CM | POA: Diagnosis not present

## 2017-12-18 DIAGNOSIS — G629 Polyneuropathy, unspecified: Secondary | ICD-10-CM | POA: Diagnosis not present

## 2017-12-26 DIAGNOSIS — M5116 Intervertebral disc disorders with radiculopathy, lumbar region: Secondary | ICD-10-CM | POA: Diagnosis not present

## 2017-12-26 DIAGNOSIS — M5136 Other intervertebral disc degeneration, lumbar region: Secondary | ICD-10-CM | POA: Diagnosis not present

## 2017-12-26 DIAGNOSIS — M5127 Other intervertebral disc displacement, lumbosacral region: Secondary | ICD-10-CM | POA: Diagnosis not present

## 2017-12-26 DIAGNOSIS — M7138 Other bursal cyst, other site: Secondary | ICD-10-CM | POA: Diagnosis not present

## 2018-01-02 DIAGNOSIS — M545 Low back pain: Secondary | ICD-10-CM | POA: Diagnosis not present

## 2018-01-02 DIAGNOSIS — I10 Essential (primary) hypertension: Secondary | ICD-10-CM | POA: Diagnosis not present

## 2018-03-16 DIAGNOSIS — K219 Gastro-esophageal reflux disease without esophagitis: Secondary | ICD-10-CM | POA: Insufficient documentation

## 2018-03-16 DIAGNOSIS — G894 Chronic pain syndrome: Secondary | ICD-10-CM | POA: Insufficient documentation

## 2018-03-16 DIAGNOSIS — Z9884 Bariatric surgery status: Secondary | ICD-10-CM | POA: Insufficient documentation

## 2018-03-16 DIAGNOSIS — G2581 Restless legs syndrome: Secondary | ICD-10-CM | POA: Insufficient documentation

## 2018-11-15 DIAGNOSIS — N3281 Overactive bladder: Secondary | ICD-10-CM | POA: Insufficient documentation

## 2018-12-12 DIAGNOSIS — N1831 Chronic kidney disease, stage 3a: Secondary | ICD-10-CM | POA: Insufficient documentation

## 2019-02-05 DIAGNOSIS — E782 Mixed hyperlipidemia: Secondary | ICD-10-CM | POA: Insufficient documentation

## 2019-04-02 DIAGNOSIS — Z01818 Encounter for other preprocedural examination: Secondary | ICD-10-CM

## 2019-04-30 DIAGNOSIS — I10 Essential (primary) hypertension: Secondary | ICD-10-CM

## 2019-04-30 DIAGNOSIS — I471 Supraventricular tachycardia: Secondary | ICD-10-CM

## 2019-04-30 DIAGNOSIS — R0789 Other chest pain: Secondary | ICD-10-CM

## 2019-04-30 DIAGNOSIS — I951 Orthostatic hypotension: Secondary | ICD-10-CM

## 2019-04-30 DIAGNOSIS — R079 Chest pain, unspecified: Secondary | ICD-10-CM

## 2019-04-30 DIAGNOSIS — Z96612 Presence of left artificial shoulder joint: Secondary | ICD-10-CM

## 2019-05-01 DIAGNOSIS — I471 Supraventricular tachycardia: Secondary | ICD-10-CM | POA: Diagnosis not present

## 2019-05-01 DIAGNOSIS — R0789 Other chest pain: Secondary | ICD-10-CM | POA: Diagnosis not present

## 2019-05-01 DIAGNOSIS — Z96612 Presence of left artificial shoulder joint: Secondary | ICD-10-CM | POA: Diagnosis not present

## 2019-05-01 DIAGNOSIS — R079 Chest pain, unspecified: Secondary | ICD-10-CM | POA: Diagnosis not present

## 2019-05-01 DIAGNOSIS — I951 Orthostatic hypotension: Secondary | ICD-10-CM | POA: Diagnosis not present

## 2019-05-01 DIAGNOSIS — I10 Essential (primary) hypertension: Secondary | ICD-10-CM | POA: Diagnosis not present

## 2019-05-02 DIAGNOSIS — I471 Supraventricular tachycardia: Secondary | ICD-10-CM | POA: Diagnosis not present

## 2019-05-02 DIAGNOSIS — Z96612 Presence of left artificial shoulder joint: Secondary | ICD-10-CM | POA: Diagnosis not present

## 2019-05-02 DIAGNOSIS — I951 Orthostatic hypotension: Secondary | ICD-10-CM | POA: Diagnosis not present

## 2019-05-02 DIAGNOSIS — R079 Chest pain, unspecified: Secondary | ICD-10-CM | POA: Diagnosis not present

## 2019-05-02 DIAGNOSIS — R0789 Other chest pain: Secondary | ICD-10-CM | POA: Diagnosis not present

## 2019-05-02 DIAGNOSIS — I10 Essential (primary) hypertension: Secondary | ICD-10-CM | POA: Diagnosis not present

## 2019-06-20 DIAGNOSIS — Z01818 Encounter for other preprocedural examination: Secondary | ICD-10-CM

## 2019-06-27 ENCOUNTER — Ambulatory Visit: Payer: Self-pay | Admitting: Cardiology

## 2019-11-04 DIAGNOSIS — I471 Supraventricular tachycardia, unspecified: Secondary | ICD-10-CM | POA: Insufficient documentation

## 2019-11-05 DIAGNOSIS — R079 Chest pain, unspecified: Secondary | ICD-10-CM

## 2019-11-05 DIAGNOSIS — K219 Gastro-esophageal reflux disease without esophagitis: Secondary | ICD-10-CM

## 2019-11-05 DIAGNOSIS — J9811 Atelectasis: Secondary | ICD-10-CM

## 2019-11-06 DIAGNOSIS — J9811 Atelectasis: Secondary | ICD-10-CM | POA: Diagnosis not present

## 2019-11-06 DIAGNOSIS — K219 Gastro-esophageal reflux disease without esophagitis: Secondary | ICD-10-CM | POA: Diagnosis not present

## 2019-11-06 DIAGNOSIS — R079 Chest pain, unspecified: Secondary | ICD-10-CM

## 2021-02-22 DIAGNOSIS — E538 Deficiency of other specified B group vitamins: Secondary | ICD-10-CM | POA: Insufficient documentation

## 2021-02-22 DIAGNOSIS — D539 Nutritional anemia, unspecified: Secondary | ICD-10-CM | POA: Insufficient documentation

## 2021-03-17 DIAGNOSIS — R000 Tachycardia, unspecified: Secondary | ICD-10-CM

## 2021-03-28 ENCOUNTER — Telehealth: Payer: Self-pay | Admitting: Oncology

## 2021-03-28 NOTE — Telephone Encounter (Signed)
Patient New Referral Status - Reinstated Patient referred by Dr Dina Rich for Macrocytic Anemia.  Appt made 04/13/21 Labs 10:30 am - Consult 11:00 am

## 2021-04-12 ENCOUNTER — Other Ambulatory Visit: Payer: Self-pay | Admitting: Oncology

## 2021-04-12 DIAGNOSIS — D539 Nutritional anemia, unspecified: Secondary | ICD-10-CM

## 2021-04-12 NOTE — Progress Notes (Signed)
[facility]  [address] [place],  [place]  [address] [phone]  Clinic Day:  04/13/2021  Referring physician: Olive Bass, MD   HISTORY OF PRESENT ILLNESS:  The patient is a 65 y.o. female who I was asked to consult upon for macrocytic anemia.  Labs in late August 2022 showed a low hemoglobin of 10, with an elevated MCV of 100.  Of note, she also had a low white count of 3.2 and a low platelet count of 139.  She denies having any overt forms of blood loss to explain her anemia.  Of note, she did undergo gastric bypass surgery 15 years ago.  The patient claims she has taken oral iron twice daily for the past 6 years.  She claims to have had issues with anemia during her childhood to where she had to take oral iron.  She states she has never undergone a colonoscopy.  To her knowledge, there is no family history of anemia or other hematologic disorders.    PAST MEDICAL HISTORY:   Past Medical History:  Diagnosis Date   Anxiety    Arthritis    CKD (chronic kidney disease), stage III (HCC)    DDD (degenerative disc disease), cervical    Depression    GERD (gastroesophageal reflux disease)    History of migraine headaches    History of non anemic vitamin B12 deficiency    Hx of ventricular tachycardia    Hypertension    Osteoporosis    Overactive bladder    SOB (shortness of breath) on exertion    Thyroid disease     PAST SURGICAL HISTORY:  Gastric bypass Hysterectomy Right knee replacement Left shoulder replacement Back surgery  CURRENT MEDICATIONS:   Current Outpatient Medications  Medication Sig Dispense Refill   alendronate (FOSAMAX) 70 MG tablet Take by mouth.     ARIPiprazole (ABILIFY) 10 MG tablet Take by mouth.     aspirin 81 MG EC tablet Take 1 tablet by mouth daily.     atorvastatin (LIPITOR) 20 MG tablet Take by mouth.     Calcium Carb-Cholecalciferol (OYSCO 500 + D) 500-200 MG-UNIT TABS Take 1 tablet by mouth daily.      cyanocobalamin 1000 MCG tablet Take 1 tablet by mouth daily.     diclofenac Sodium (VOLTAREN) 1 % GEL APPLY 2 GRAMS TO THE AFFECTED AREA UP TO 4 TIMES DAILY     DULoxetine (CYMBALTA) 20 MG capsule TAKE ONE CAPSULE BY MOUTH EVERY NIGHT AT BEDTIME IN ADDITON TO THE DULOXETINE IN THE MORNING     ergocalciferol (VITAMIN D2) 1.25 MG (50000 UT) capsule Take 1 capsule by mouth once a week.     ferrous sulfate 325 (65 FE) MG tablet Take by mouth.     flecainide (TAMBOCOR) 50 MG tablet Take by mouth.     furosemide (LASIX) 20 MG tablet Take 1 tablet by mouth daily as needed.     hydrOXYzine (ATARAX/VISTARIL) 25 MG tablet TAKE 1 TO 2 TABLETS BY MOUTH THREE TIMES DAILY AS NEEDED FOR ANXIETY     ibuprofen (ADVIL) 800 MG tablet Take 1 tablet by mouth every 8 (eight) hours as needed.     influenza vac split quadrivalent PF (FLUARIX) 0.5 ML injection ADM 0.5ML IM UTD     levothyroxine (SYNTHROID) 150 MCG tablet Take by mouth.     methocarbamol (ROBAXIN) 500 MG tablet Take by mouth.     metoprolol tartrate (LOPRESSOR) 50 MG tablet Take by mouth.  montelukast (SINGULAIR) 10 MG tablet Take 1 tablet by mouth nightly for allergies.     nabumetone (RELAFEN) 750 MG tablet Take by mouth.     ondansetron (ZOFRAN) 4 MG tablet TAKE 1 TABLET BY MOUTH EVERY 8 HOURS AS NEEDED FOR NAUSEA AND VOMITING     pantoprazole (PROTONIX) 40 MG tablet Take by mouth.     pregabalin (LYRICA) 75 MG capsule Take by mouth.     senna-docusate (SENOKOT-S) 8.6-50 MG tablet Take 1 tablet daily PRN constipation.     solifenacin (VESICARE) 10 MG tablet Take by mouth.     verapamil (CALAN-SR) 180 MG CR tablet Take by mouth.     atenolol (TENORMIN) 50 MG tablet Take by mouth.     buPROPion (WELLBUTRIN SR) 150 MG 12 hr tablet Take by mouth.     busPIRone (BUSPAR) 10 MG tablet Take by mouth.     Calcium Carbonate-Vit D-Min (RA CALCIUM 600/VIT D/MINERALS) 600-200 MG-UNIT TABS Take by mouth.     cetirizine (ZYRTEC) 10 MG tablet Take by mouth.      Cholecalciferol 25 MCG (1000 UT) tablet Take by mouth.     fluticasone (FLONASE) 50 MCG/ACT nasal spray 2 sprays by Each Nare route daily as needed for Allergies.     Multiple Vitamin (MULTI-VITAMIN) tablet Take 1 tablet by mouth daily.     oxyCODONE-acetaminophen (PERCOCET/ROXICET) 5-325 MG tablet Take 1 tablet by mouth every 8 (eight) hours as needed.     pramipexole (MIRAPEX) 1.5 MG tablet Take by mouth.     prazosin (MINIPRESS) 1 MG capsule Take by mouth.     rOPINIRole (REQUIP) 0.25 MG tablet Take by mouth.     traZODone (DESYREL) 100 MG tablet Take by mouth.     No current facility-administered medications for this visit.    ALLERGIES:   Allergies  Allergen Reactions   Cephalexin Hives   Levofloxacin Hives   Sulfamethoxazole Hives   Oxycodone     Other reaction(s): Hallucinations    FAMILY HISTORY:   Family History  Problem Relation Age of Onset   Colon cancer Mother    Heart attack Mother    Lung cancer Father    Breast cancer Maternal Aunt     SOCIAL HISTORY:  The patient was born and raised in [place].  She lives in town.  She is divorced, with 1 child.  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  There is no history of alcohol or tobacco abuse.    REVIEW OF SYSTEMS:  Review of Systems  Constitutional:  Positive for fatigue. Negative for fever.  HENT:   Negative for hearing loss and sore throat.   Eyes:  Negative for eye problems.  Respiratory:  Negative for chest tightness, cough and hemoptysis.   Cardiovascular:  Negative for chest pain and palpitations.  Gastrointestinal:  Positive for diarrhea, nausea and vomiting. Negative for abdominal distention, abdominal pain, blood in stool and constipation.  Endocrine: Negative for hot flashes.  Genitourinary:  Negative for difficulty urinating, dysuria, frequency, hematuria and nocturia.   Musculoskeletal:  Positive for arthralgias. Negative for back pain, gait problem and myalgias.   Skin: Negative.  Negative for itching and rash.  Neurological: Negative.  Negative for dizziness, extremity weakness, gait problem, headaches, light-headedness and numbness.  Hematological: Negative.   Psychiatric/Behavioral:  Positive for depression. Negative for suicidal ideas. The patient is nervous/anxious.     PHYSICAL EXAM:  Blood pressure 125/68, pulse 73, temperature 98 F (36.7 C), resp. rate  14, height 5\' 5"  (1.651 m), weight 159 lb 12.8 oz (72.5 kg), SpO2 93 %. Wt Readings from Last 3 Encounters:  04/13/21 159 lb 12.8 oz (72.5 kg)   Body mass index is 26.59 kg/m. Performance status (ECOG): 1 - Symptomatic but completely ambulatory Physical Exam Constitutional:      Appearance: Normal appearance. She is ill-appearing (chronically ill appearance).     Comments: Ambulates with cane  HENT:     Mouth/Throat:     Mouth: Mucous membranes are moist.     Pharynx: Oropharynx is clear. No oropharyngeal exudate or posterior oropharyngeal erythema.  Cardiovascular:     Rate and Rhythm: Normal rate and regular rhythm.     Heart sounds: No murmur heard.   No friction rub. No gallop.  Pulmonary:     Effort: Pulmonary effort is normal. No respiratory distress.     Breath sounds: Normal breath sounds. No wheezing, rhonchi or rales.  Abdominal:     General: Bowel sounds are normal. There is no distension.     Palpations: Abdomen is soft. There is no mass.     Tenderness: There is no abdominal tenderness.  Musculoskeletal:        General: No swelling.     Right lower leg: No edema.     Left lower leg: No edema.  Lymphadenopathy:     Cervical: No cervical adenopathy.     Upper Body:     Right upper body: No supraclavicular or axillary adenopathy.     Left upper body: No supraclavicular or axillary adenopathy.     Lower Body: No right inguinal adenopathy. No left inguinal adenopathy.  Skin:    General: Skin is warm.     Coloration: Skin is not jaundiced.     Findings: No lesion  or rash.  Neurological:     General: No focal deficit present.     Mental Status: She is alert and oriented to person, place, and time. Mental status is at baseline.     Cranial Nerves: Cranial nerves are intact.  Psychiatric:        Mood and Affect: Mood normal.        Behavior: Behavior normal.        Thought Content: Thought content normal.   LABS:    CMP Latest Ref Rng & Units 04/13/2021  BUN 4 - 21 6  Creatinine 0.5 - 1.1 0.9  Sodium 137 - 147 135(A)  Potassium 3.4 - 5.3 3.2(A)  Chloride 99 - 108 105  CO2 13 - 22 20  Calcium 8.7 - 10.7 8.3(A)  Alkaline Phos 25 - 125 152(A)  AST 13 - 35 49(A)  ALT 7 - 35 26    ASSESSMENT & PLAN:  A 65 y.o. female who I was asked to consult upon for anemia.  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  I will check her iron, B12 and folate levels to ensure no nutritional deficiencies are factoring into her low counts.  Her TSH level will be checked to ensure severe thyroid disease is not factoring into her low counts.  A serum protein electrophoresis will also be checked to ensure an underlying plasma cell dyscrasia is not factoring into her low counts.  When looking at her medication list, she is not on any medications which are known to cause cytopenias via bone marrow suppression.  She understands her counts are not dangerously low.  In fact, her hemoglobin is better today than what it  was previously.  I will see this patient back in 2 weeks to go over all of her labs collected today and their implications.  The patient understands all the plans discussed today and is in agreement with them.  I do appreciate Dough, Doris Cheadle, MD for his new consult.   Ladell Lea Kirby Funk, MD

## 2021-04-13 ENCOUNTER — Inpatient Hospital Stay: Payer: Medicare Other | Attending: Oncology

## 2021-04-13 ENCOUNTER — Encounter: Payer: Self-pay | Admitting: Oncology

## 2021-04-13 ENCOUNTER — Other Ambulatory Visit: Payer: Self-pay | Admitting: Oncology

## 2021-04-13 ENCOUNTER — Inpatient Hospital Stay (INDEPENDENT_AMBULATORY_CARE_PROVIDER_SITE_OTHER): Payer: Medicare Other | Admitting: Oncology

## 2021-04-13 ENCOUNTER — Other Ambulatory Visit: Payer: Self-pay | Admitting: Hematology and Oncology

## 2021-04-13 VITALS — BP 125/68 | HR 73 | Temp 98.0°F | Resp 14 | Ht 65.0 in | Wt 159.8 lb

## 2021-04-13 DIAGNOSIS — D539 Nutritional anemia, unspecified: Secondary | ICD-10-CM

## 2021-04-13 DIAGNOSIS — D649 Anemia, unspecified: Secondary | ICD-10-CM | POA: Diagnosis present

## 2021-04-13 LAB — BASIC METABOLIC PANEL
BUN: 6 (ref 4–21)
CO2: 20 (ref 13–22)
Chloride: 105 (ref 99–108)
Creatinine: 0.9 (ref 0.5–1.1)
Glucose: 124
Potassium: 3.2 — AB (ref 3.4–5.3)
Sodium: 135 — AB (ref 137–147)

## 2021-04-13 LAB — CBC AND DIFFERENTIAL
HCT: 38 (ref 36–46)
Hemoglobin: 12 (ref 12.0–16.0)
Neutrophils Absolute: 2.31
Platelets: 108 — AB (ref 150–399)
WBC: 3.3

## 2021-04-13 LAB — COMPREHENSIVE METABOLIC PANEL
Albumin: 2.7 — AB (ref 3.5–5.0)
Calcium: 8.3 — AB (ref 8.7–10.7)

## 2021-04-13 LAB — IRON AND TIBC
Iron: 71 ug/dL (ref 28–170)
Saturation Ratios: 50 % — ABNORMAL HIGH (ref 10.4–31.8)
TIBC: 141 ug/dL — ABNORMAL LOW (ref 250–450)
UIBC: 70 ug/dL

## 2021-04-13 LAB — HEPATIC FUNCTION PANEL
ALT: 26 (ref 7–35)
AST: 49 — AB (ref 13–35)
Alkaline Phosphatase: 152 — AB (ref 25–125)
Bilirubin, Total: 1.2

## 2021-04-13 LAB — TSH: TSH: 1.07 u[IU]/mL (ref 0.350–4.500)

## 2021-04-13 LAB — FOLATE: Folate: 22.6 ng/mL (ref >5.9)

## 2021-04-13 LAB — CBC: RBC: 3.69 — AB (ref 3.87–5.11)

## 2021-04-13 LAB — FERRITIN: Ferritin: 363 ng/mL — ABNORMAL HIGH (ref 11–307)

## 2021-04-13 LAB — VITAMIN B12: Vitamin B-12: >7500 pg/mL — ABNORMAL HIGH (ref 180–914)

## 2021-04-14 LAB — SOLUBLE TRANSFERRIN RECEPTOR: Transferrin Receptor: 23.2 nmol/L (ref 12.2–27.3)

## 2021-04-15 LAB — PROTEIN ELECTROPHORESIS, SERUM
A/G Ratio: 1 (ref 0.7–1.7)
Albumin ELP: 2.7 g/dL — ABNORMAL LOW (ref 2.9–4.4)
Alpha-1-Globulin: 0.2 g/dL (ref 0.0–0.4)
Alpha-2-Globulin: 0.6 g/dL (ref 0.4–1.0)
Beta Globulin: 0.7 g/dL (ref 0.7–1.3)
Gamma Globulin: 1.2 g/dL (ref 0.4–1.8)
Globulin, Total: 2.7 g/dL (ref 2.2–3.9)
Total Protein ELP: 5.4 g/dL — ABNORMAL LOW (ref 6.0–8.5)

## 2021-04-18 ENCOUNTER — Telehealth: Payer: Self-pay

## 2021-04-18 ENCOUNTER — Inpatient Hospital Stay (HOSPITAL_COMMUNITY)
Admission: EM | Admit: 2021-04-18 | Discharge: 2021-04-20 | DRG: 643 | Disposition: A | Discharge status: Home / Self Care | Payer: Medicare Other | Attending: Student in an Organized Health Care Education/Training Program | Admitting: Student in an Organized Health Care Education/Training Program

## 2021-04-18 ENCOUNTER — Emergency Department (HOSPITAL_COMMUNITY): Payer: Medicare Other

## 2021-04-18 ENCOUNTER — Encounter (HOSPITAL_COMMUNITY): Payer: Self-pay

## 2021-04-18 ENCOUNTER — Other Ambulatory Visit: Payer: Self-pay

## 2021-04-18 DIAGNOSIS — T381X5A Adverse effect of thyroid hormones and substitutes, initial encounter: Secondary | ICD-10-CM | POA: Diagnosis present

## 2021-04-18 DIAGNOSIS — E0580 Other thyrotoxicosis without thyrotoxic crisis or storm: Secondary | ICD-10-CM | POA: Diagnosis not present

## 2021-04-18 DIAGNOSIS — N3281 Overactive bladder: Secondary | ICD-10-CM | POA: Diagnosis present

## 2021-04-18 DIAGNOSIS — Z79890 Hormone replacement therapy (postmenopausal): Secondary | ICD-10-CM

## 2021-04-18 DIAGNOSIS — R1012 Left upper quadrant pain: Secondary | ICD-10-CM | POA: Diagnosis present

## 2021-04-18 DIAGNOSIS — R634 Abnormal weight loss: Secondary | ICD-10-CM | POA: Diagnosis present

## 2021-04-18 DIAGNOSIS — Z7983 Long term (current) use of bisphosphonates: Secondary | ICD-10-CM

## 2021-04-18 DIAGNOSIS — R7989 Other specified abnormal findings of blood chemistry: Secondary | ICD-10-CM | POA: Diagnosis present

## 2021-04-18 DIAGNOSIS — Z8249 Family history of ischemic heart disease and other diseases of the circulatory system: Secondary | ICD-10-CM

## 2021-04-18 DIAGNOSIS — Z885 Allergy status to narcotic agent status: Secondary | ICD-10-CM

## 2021-04-18 DIAGNOSIS — M1990 Unspecified osteoarthritis, unspecified site: Secondary | ICD-10-CM | POA: Diagnosis present

## 2021-04-18 DIAGNOSIS — Z803 Family history of malignant neoplasm of breast: Secondary | ICD-10-CM

## 2021-04-18 DIAGNOSIS — Z79899 Other long term (current) drug therapy: Secondary | ICD-10-CM

## 2021-04-18 DIAGNOSIS — F32A Depression, unspecified: Secondary | ICD-10-CM | POA: Diagnosis present

## 2021-04-18 DIAGNOSIS — N390 Urinary tract infection, site not specified: Secondary | ICD-10-CM | POA: Diagnosis present

## 2021-04-18 DIAGNOSIS — Z800 Family history of malignant neoplasm of digestive organs: Secondary | ICD-10-CM

## 2021-04-18 DIAGNOSIS — Z801 Family history of malignant neoplasm of trachea, bronchus and lung: Secondary | ICD-10-CM

## 2021-04-18 DIAGNOSIS — R000 Tachycardia, unspecified: Secondary | ICD-10-CM | POA: Diagnosis present

## 2021-04-18 DIAGNOSIS — Z96612 Presence of left artificial shoulder joint: Secondary | ICD-10-CM | POA: Diagnosis present

## 2021-04-18 DIAGNOSIS — R109 Unspecified abdominal pain: Secondary | ICD-10-CM | POA: Diagnosis present

## 2021-04-18 DIAGNOSIS — Z20822 Contact with and (suspected) exposure to covid-19: Secondary | ICD-10-CM | POA: Diagnosis present

## 2021-04-18 DIAGNOSIS — Z96651 Presence of right artificial knee joint: Secondary | ICD-10-CM | POA: Diagnosis present

## 2021-04-18 DIAGNOSIS — E860 Dehydration: Secondary | ICD-10-CM | POA: Diagnosis present

## 2021-04-18 DIAGNOSIS — A419 Sepsis, unspecified organism: Secondary | ICD-10-CM

## 2021-04-18 DIAGNOSIS — E872 Acidosis, unspecified: Secondary | ICD-10-CM | POA: Diagnosis present

## 2021-04-18 DIAGNOSIS — R778 Other specified abnormalities of plasma proteins: Secondary | ICD-10-CM | POA: Diagnosis present

## 2021-04-18 DIAGNOSIS — E876 Hypokalemia: Secondary | ICD-10-CM | POA: Diagnosis present

## 2021-04-18 DIAGNOSIS — G2581 Restless legs syndrome: Secondary | ICD-10-CM | POA: Diagnosis present

## 2021-04-18 DIAGNOSIS — Z6825 Body mass index (BMI) 25.0-25.9, adult: Secondary | ICD-10-CM

## 2021-04-18 DIAGNOSIS — E039 Hypothyroidism, unspecified: Secondary | ICD-10-CM | POA: Diagnosis present

## 2021-04-18 DIAGNOSIS — Z881 Allergy status to other antibiotic agents status: Secondary | ICD-10-CM

## 2021-04-18 DIAGNOSIS — D509 Iron deficiency anemia, unspecified: Secondary | ICD-10-CM | POA: Diagnosis present

## 2021-04-18 DIAGNOSIS — R17 Unspecified jaundice: Secondary | ICD-10-CM | POA: Diagnosis present

## 2021-04-18 DIAGNOSIS — E43 Unspecified severe protein-calorie malnutrition: Secondary | ICD-10-CM | POA: Diagnosis present

## 2021-04-18 DIAGNOSIS — I10 Essential (primary) hypertension: Secondary | ICD-10-CM | POA: Diagnosis present

## 2021-04-18 DIAGNOSIS — M5030 Other cervical disc degeneration, unspecified cervical region: Secondary | ICD-10-CM | POA: Diagnosis present

## 2021-04-18 DIAGNOSIS — Z7982 Long term (current) use of aspirin: Secondary | ICD-10-CM

## 2021-04-18 DIAGNOSIS — M810 Age-related osteoporosis without current pathological fracture: Secondary | ICD-10-CM | POA: Diagnosis present

## 2021-04-18 DIAGNOSIS — Z9884 Bariatric surgery status: Secondary | ICD-10-CM

## 2021-04-18 DIAGNOSIS — F419 Anxiety disorder, unspecified: Secondary | ICD-10-CM | POA: Diagnosis present

## 2021-04-18 DIAGNOSIS — R112 Nausea with vomiting, unspecified: Secondary | ICD-10-CM | POA: Diagnosis present

## 2021-04-18 DIAGNOSIS — Z90710 Acquired absence of both cervix and uterus: Secondary | ICD-10-CM

## 2021-04-18 DIAGNOSIS — Z8679 Personal history of other diseases of the circulatory system: Secondary | ICD-10-CM

## 2021-04-18 DIAGNOSIS — K219 Gastro-esophageal reflux disease without esophagitis: Secondary | ICD-10-CM | POA: Diagnosis present

## 2021-04-18 DIAGNOSIS — Z882 Allergy status to sulfonamides status: Secondary | ICD-10-CM

## 2021-04-18 LAB — CBC WITH DIFFERENTIAL/PLATELET
Abs Immature Granulocytes: 0.01 10*3/uL (ref 0.00–0.07)
Basophils Absolute: 0 10*3/uL (ref 0.0–0.1)
Basophils Relative: 1 %
Eosinophils Absolute: 0 10*3/uL (ref 0.0–0.5)
Eosinophils Relative: 0 %
HCT: 34.3 % — ABNORMAL LOW (ref 36.0–46.0)
Hemoglobin: 11.8 g/dL — ABNORMAL LOW (ref 12.0–15.0)
Immature Granulocytes: 0 %
Lymphocytes Relative: 38 %
Lymphs Abs: 2.2 10*3/uL (ref 0.7–4.0)
MCH: 32.4 pg (ref 26.0–34.0)
MCHC: 34.4 g/dL (ref 30.0–36.0)
MCV: 94.2 fL (ref 80.0–100.0)
Monocytes Absolute: 0.5 10*3/uL (ref 0.1–1.0)
Monocytes Relative: 8 %
Neutro Abs: 3.2 10*3/uL (ref 1.7–7.7)
Neutrophils Relative %: 53 %
Platelets: 259 10*3/uL (ref 150–400)
RBC: 3.64 MIL/uL — ABNORMAL LOW (ref 3.87–5.11)
RDW: 13.1 % (ref 11.5–15.5)
WBC: 6 10*3/uL (ref 4.0–10.5)
nRBC: 0 % (ref 0.0–0.2)

## 2021-04-18 LAB — URINALYSIS, ROUTINE W REFLEX MICROSCOPIC
Bilirubin Urine: NEGATIVE
Glucose, UA: NEGATIVE mg/dL
Hgb urine dipstick: NEGATIVE
Ketones, ur: NEGATIVE mg/dL
Nitrite: NEGATIVE
Protein, ur: NEGATIVE mg/dL
Specific Gravity, Urine: 1.024 (ref 1.005–1.030)
pH: 7 (ref 5.0–8.0)

## 2021-04-18 LAB — COMPREHENSIVE METABOLIC PANEL
ALT: 26 U/L (ref 0–44)
AST: 43 U/L — ABNORMAL HIGH (ref 15–41)
Albumin: 2.1 g/dL — ABNORMAL LOW (ref 3.5–5.0)
Alkaline Phosphatase: 123 U/L (ref 38–126)
Anion gap: 12 (ref 5–15)
BUN: <5 mg/dL — ABNORMAL LOW (ref 8–23)
CO2: 19 mmol/L — ABNORMAL LOW (ref 22–32)
Calcium: 8.7 mg/dL — ABNORMAL LOW (ref 8.9–10.3)
Chloride: 105 mmol/L (ref 98–111)
Creatinine, Ser: 0.91 mg/dL (ref 0.44–1.00)
GFR, Estimated: >60 mL/min (ref >60)
Glucose, Bld: 106 mg/dL — ABNORMAL HIGH (ref 70–99)
Potassium: 3.3 mmol/L — ABNORMAL LOW (ref 3.5–5.1)
Sodium: 136 mmol/L (ref 135–145)
Total Bilirubin: 1.8 mg/dL — ABNORMAL HIGH (ref 0.3–1.2)
Total Protein: 5.6 g/dL — ABNORMAL LOW (ref 6.5–8.1)

## 2021-04-18 LAB — RESP PANEL BY RT-PCR (FLU A&B, COVID) ARPGX2
Influenza A by PCR: NEGATIVE
Influenza B by PCR: NEGATIVE
SARS Coronavirus 2 by RT PCR: NEGATIVE

## 2021-04-18 LAB — T4, FREE: Free T4: 2.62 ng/dL — ABNORMAL HIGH (ref 0.61–1.12)

## 2021-04-18 LAB — LACTIC ACID, PLASMA
Lactic Acid, Venous: 3.6 mmol/L (ref 0.5–1.9)
Lactic Acid, Venous: 3.2 mmol/L (ref 0.5–1.9)

## 2021-04-18 LAB — LIPASE, BLOOD: Lipase: 24 U/L (ref 11–51)

## 2021-04-18 LAB — TROPONIN I (HIGH SENSITIVITY)
Troponin I (High Sensitivity): 38 ng/L — ABNORMAL HIGH (ref <18)
Troponin I (High Sensitivity): 56 ng/L — ABNORMAL HIGH (ref <18)

## 2021-04-18 LAB — TSH: TSH: 0.215 u[IU]/mL — ABNORMAL LOW (ref 0.350–4.500)

## 2021-04-18 MED ORDER — ONDANSETRON HCL 4 MG/2ML IJ SOLN
4.0000 mg | Freq: Once | INTRAMUSCULAR | Status: AC
  Administered 2021-04-18: 4 mg via INTRAVENOUS
  Filled 2021-04-18: qty 2

## 2021-04-18 MED ORDER — METOCLOPRAMIDE HCL 5 MG/ML IJ SOLN
10.0000 mg | Freq: Once | INTRAMUSCULAR | Status: AC
  Administered 2021-04-18: 10 mg via INTRAVENOUS
  Filled 2021-04-18: qty 2

## 2021-04-18 MED ORDER — SODIUM CHLORIDE 0.9 % IV SOLN
2.0000 g | INTRAVENOUS | Status: DC
  Administered 2021-04-18: 2 g via INTRAVENOUS
  Filled 2021-04-18: qty 20

## 2021-04-18 MED ORDER — MORPHINE SULFATE (PF) 4 MG/ML IV SOLN
4.0000 mg | Freq: Once | INTRAVENOUS | Status: AC
  Administered 2021-04-18: 4 mg via INTRAVENOUS
  Filled 2021-04-18: qty 1

## 2021-04-18 MED ORDER — SODIUM CHLORIDE 0.9 % IV BOLUS
1000.0000 mL | Freq: Once | INTRAVENOUS | Status: AC
  Administered 2021-04-18: 1000 mL via INTRAVENOUS

## 2021-04-18 MED ORDER — DIPHENHYDRAMINE HCL 50 MG/ML IJ SOLN
12.5000 mg | Freq: Once | INTRAMUSCULAR | Status: AC
  Administered 2021-04-18: 12.5 mg via INTRAVENOUS
  Filled 2021-04-18: qty 1

## 2021-04-18 MED ORDER — IOHEXOL 300 MG/ML  SOLN
100.0000 mL | Freq: Once | INTRAMUSCULAR | Status: AC | PRN
  Administered 2021-04-18: 100 mL via INTRAVENOUS

## 2021-04-18 MED ORDER — IOHEXOL 350 MG/ML SOLN
75.0000 mL | Freq: Once | INTRAVENOUS | Status: AC | PRN
  Administered 2021-04-18: 75 mL via INTRAVENOUS

## 2021-04-18 NOTE — ED Provider Notes (Addendum)
[facility] EMERGENCY DEPARTMENT Provider Note   CSN: [id number] Arrival date & time: 04/18/21  1431     History Chief Complaint  Patient presents with   Weakness    Brenda Mooney is a 65 y.o. female history of CKD, hypothyroidism, here presenting with shortness of breath and palpitations or abdominal pain and vomiting.  Patient was recently admitted to [facility] for small bowel obstruction.  She was discharged about 10 days ago. She states that since discharge, she was unable to tolerate any fluids.  She has also shortness of breath and palpitations.  She states that whenever she stands up she feels dizzy.  Denies any fevers or chills.  She does have some dysuria.  The history is provided by the patient.      Past Medical History:  Diagnosis Date   Anxiety    Arthritis    CKD (chronic kidney disease), stage III (HCC)    DDD (degenerative disc disease), cervical    Depression    GERD (gastroesophageal reflux disease)    History of migraine headaches    History of non anemic vitamin B12 deficiency    Hx of ventricular tachycardia    Hypertension    Osteoporosis    Overactive bladder    SOB (shortness of breath) on exertion    Thyroid disease     Patient Active Problem List   Diagnosis Date Noted   Anemia, macrocytic 02/22/2021   Vitamin B12 deficiency 02/22/2021   Paroxysmal supraventricular tachycardia (HCC) 11/04/2019   Mixed hyperlipidemia 02/05/2019   Stage 3a chronic kidney disease (HCC) 12/12/2018   Overactive bladder 11/15/2018   Chronic pain syndrome 03/16/2018   Gastric bypass status for obesity 03/16/2018   GERD (gastroesophageal reflux disease) 03/16/2018   RLS (restless legs syndrome) 03/16/2018   Other intervertebral disc degeneration, lumbar region 04/20/2015   Osteoarthritis 04/15/2014   Osteoporosis 04/15/2014   Posttraumatic stress disorder 02/11/2014   Depressive disorder 12/14/2012   Hypertension, benign 12/14/2012    Hypothyroidism 12/14/2012   Insomnia 12/14/2012   Iron deficiency anemia secondary to inadequate dietary iron intake 12/14/2012   Recurrent major depressive disorder, in full remission (HCC) 12/14/2012    Past Surgical History:  Procedure Laterality Date   APPENDECTOMY     BACK SURGERY     BOWEL RESECTION     CARPAL TUNNEL RELEASE Right    GASTRIC BYPASS     INDUCED ABORTION     X 2   REPLACEMENT TOTAL KNEE Right    SALPINGOOPHORECTOMY Left    TOTAL SHOULDER REPLACEMENT Left    UTERINE ABLATION     VAGINAL HYSTERECTOMY     WISDOM TOOTH EXTRACTION       OB History   No obstetric history on file.     Family History  Problem Relation Age of Onset   Colon cancer Mother    Heart attack Mother    Lung cancer Father    Breast cancer Maternal Aunt     Social History   Tobacco Use   Smoking status: Never   Smokeless tobacco: Never  Vaping Use   Vaping Use: Never used  Substance Use Topics   Alcohol use: Not Currently   Drug use: Not Currently    Home Medications Prior to Admission medications   Medication Sig Start Date End Date Taking? Authorizing Provider  alendronate (FOSAMAX) 70 MG tablet Take by mouth. 11/06/18   [provider]  ARIPiprazole (ABILIFY) 10 MG tablet Take by mouth.  03/29/15   [provider]  aspirin 81 MG EC tablet Take 1 tablet by mouth daily. 06/26/19   [provider]  atenolol (TENORMIN) 50 MG tablet Take by mouth.    [provider]  atorvastatin (LIPITOR) 20 MG tablet Take by mouth. 10/24/19   [provider]  buPROPion (WELLBUTRIN SR) 150 MG 12 hr tablet Take by mouth.    [provider]  busPIRone (BUSPAR) 10 MG tablet Take by mouth.    [provider]  Calcium Carb-Cholecalciferol (OYSCO 500 + D) 500-200 MG-UNIT TABS Take 1 tablet by mouth daily. 08/06/19   [provider]  Calcium Carbonate-Vit D-Min (RA CALCIUM 600/VIT D/MINERALS) 600-200 MG-UNIT TABS Take by mouth.     [provider]  cetirizine (ZYRTEC) 10 MG tablet Take by mouth.    [provider]  Cholecalciferol 25 MCG (1000 UT) tablet Take by mouth.    [provider]  cyanocobalamin 1000 MCG tablet Take 1 tablet by mouth daily. 11/17/18   [provider]  diclofenac Sodium (VOLTAREN) 1 % GEL APPLY 2 GRAMS TO THE AFFECTED AREA UP TO 4 TIMES DAILY 11/25/18   [provider]  DULoxetine (CYMBALTA) 20 MG capsule TAKE ONE CAPSULE BY MOUTH EVERY NIGHT AT BEDTIME IN ADDITON TO THE DULOXETINE IN THE MORNING 02/03/21   [provider]  ergocalciferol (VITAMIN D2) 1.25 MG (50000 UT) capsule Take 1 capsule by mouth once a week. 09/26/18   [provider]  ferrous sulfate 325 (65 FE) MG tablet Take by mouth. 11/17/18   [provider]  flecainide (TAMBOCOR) 50 MG tablet Take by mouth. 03/04/20   [provider]  fluticasone (FLONASE) 50 MCG/ACT nasal spray 2 sprays by Each Nare route daily as needed for Allergies.    [provider]  furosemide (LASIX) 20 MG tablet Take 1 tablet by mouth daily as needed. 03/04/20   [provider]  hydrOXYzine (ATARAX/VISTARIL) 25 MG tablet TAKE 1 TO 2 TABLETS BY MOUTH THREE TIMES DAILY AS NEEDED FOR ANXIETY 11/18/20   [provider]  ibuprofen (ADVIL) 800 MG tablet Take 1 tablet by mouth every 8 (eight) hours as needed. 12/07/16   [provider]  influenza vac split quadrivalent PF (FLUARIX) 0.5 ML injection ADM 0.5ML IM UTD 04/17/17   [provider]  levothyroxine (SYNTHROID) 150 MCG tablet Take by mouth. 02/10/19   [provider]  methocarbamol (ROBAXIN) 500 MG tablet Take by mouth. 05/19/20   [provider]  metoprolol tartrate (LOPRESSOR) 50 MG tablet Take by mouth. 02/24/21   [provider]  montelukast (SINGULAIR) 10 MG tablet Take 1 tablet by mouth nightly for allergies. 09/26/18   [provider]  Multiple Vitamin  (MULTI-VITAMIN) tablet Take 1 tablet by mouth daily.    [provider]  nabumetone (RELAFEN) 750 MG tablet Take by mouth. 03/22/15   [provider]  ondansetron (ZOFRAN) 4 MG tablet TAKE 1 TABLET BY MOUTH EVERY 8 HOURS AS NEEDED FOR NAUSEA AND VOMITING 03/25/21   [provider]  oxyCODONE-acetaminophen (PERCOCET/ROXICET) 5-325 MG tablet Take 1 tablet by mouth every 8 (eight) hours as needed. 03/31/21   [provider]  pantoprazole (PROTONIX) 40 MG tablet Take by mouth. 03/16/17   [provider]  pramipexole (MIRAPEX) 1.5 MG tablet Take by mouth.    [provider]  prazosin (MINIPRESS) 1 MG capsule Take by mouth.    [provider]  pregabalin (LYRICA) 75 MG capsule Take  by mouth. 08/30/16   [provider]  rOPINIRole (REQUIP) 0.25 MG tablet Take by mouth.    [provider]  senna-docusate (SENOKOT-S) 8.6-50 MG tablet Take 1 tablet daily PRN constipation. 03/23/18   [provider]  solifenacin (VESICARE) 10 MG tablet Take by mouth. 01/30/20   [provider]  traZODone (DESYREL) 100 MG tablet Take by mouth.    [provider]  verapamil (CALAN-SR) 180 MG CR tablet Take by mouth. 12/01/20   [provider]    Allergies    Cephalexin, Levofloxacin, Sulfamethoxazole, and Oxycodone  Review of Systems   Review of Systems  Cardiovascular:  Positive for palpitations.  Gastrointestinal:  Positive for abdominal pain and vomiting.  Neurological:  Positive for weakness.  All other systems reviewed and are negative.  Physical Exam Updated Vital Signs BP (!) 125/94   Pulse (!) 125   Temp 98.7 F (37.1 C) (Oral)   Resp 18   Ht 5\' 5"  (1.651 m)   Wt 70.3 kg   SpO2 100%   BMI 25.79 kg/m   Physical Exam Vitals and nursing note reviewed.  Constitutional:      Comments: Uncomfortable, dehydrated  HENT:     Head: Normocephalic.     Nose: Nose normal.     Mouth/Throat:      Mouth: Mucous membranes are dry.  Eyes:     Extraocular Movements: Extraocular movements intact.     Pupils: Pupils are equal, round, and reactive to light.  Cardiovascular:     Rate and Rhythm: Regular rhythm. Tachycardia present.     Pulses: Normal pulses.     Heart sounds: Normal heart sounds.  Pulmonary:     Effort: Pulmonary effort is normal.     Breath sounds: Normal breath sounds.  Abdominal:     General: Abdomen is flat.     Palpations: Abdomen is soft.     Comments: Mild epigastric tenderness and bilateral CVAT  Musculoskeletal:        General: Normal range of motion.     Cervical back: Normal range of motion and neck supple.  Skin:    General: Skin is warm.     Capillary Refill: Capillary refill takes less than 2 seconds.  Neurological:     General: No focal deficit present.     Mental Status: She is oriented to person, place, and time.  Psychiatric:        Mood and Affect: Mood normal.        Behavior: Behavior normal.    ED Results / Procedures / Treatments   Labs (all labs ordered are listed, but only abnormal results are displayed) Labs Reviewed  LACTIC ACID, PLASMA - Abnormal; Notable for the following components:      Result Value   Lactic Acid, Venous 3.6 (*)    All other components within normal limits  LACTIC ACID, PLASMA - Abnormal; Notable for the following components:   Lactic Acid, Venous 3.2 (*)    All other components within normal limits  COMPREHENSIVE METABOLIC PANEL - Abnormal; Notable for the following components:   Potassium 3.3 (*)    CO2 19 (*)    Glucose, Bld 106 (*)    BUN <5 (*)    Calcium 8.7 (*)    Total Protein 5.6 (*)    Albumin 2.1 (*)    AST 43 (*)    Total Bilirubin 1.8 (*)    All other components within normal limits  CBC WITH DIFFERENTIAL/PLATELET -  Abnormal; Notable for the following components:   RBC 3.64 (*)    Hemoglobin 11.8 (*)    HCT 34.3 (*)    All other components within normal limits  URINALYSIS, ROUTINE W  REFLEX MICROSCOPIC - Abnormal; Notable for the following components:   APPearance HAZY (*)    Leukocytes,Ua LARGE (*)    Bacteria, UA MANY (*)    All other components within normal limits  TSH - Abnormal; Notable for the following components:   TSH 0.215 (*)    All other components within normal limits  T4, FREE - Abnormal; Notable for the following components:   Free T4 2.62 (*)    All other components within normal limits  TROPONIN I (HIGH SENSITIVITY) - Abnormal; Notable for the following components:   Troponin I (High Sensitivity) 38 (*)    All other components within normal limits  TROPONIN I (HIGH SENSITIVITY) - Abnormal; Notable for the following components:   Troponin I (High Sensitivity) 56 (*)    All other components within normal limits  RESP PANEL BY RT-PCR (FLU A&B, COVID) ARPGX2  CULTURE, BLOOD (ROUTINE X 2)  CULTURE, BLOOD (ROUTINE X 2)  LIPASE, BLOOD  T3    EKG EKG Interpretation  Date/Time:  Monday April 18 2021 16:32:29 EDT Ventricular Rate:  128 PR Interval:    QRS Duration: 64 QT Interval:  298 QTC Calculation: 435 R Axis:   -14 Text Interpretation: Accelerated Junctional rhythm Left ventricular hypertrophy with repolarization abnormality ( R in aVL ) Abnormal ECG poor baseline, no previous to compare Confirmed by Richardean Canal [phone]) on 04/18/2021 8:08:36 PM  Radiology CT HEAD WO CONTRAST ( )  Result Date: 04/18/2021 CLINICAL DATA:  Dizziness, non-specific EXAM: CT HEAD WITHOUT CONTRAST TECHNIQUE: Contiguous axial images were obtained from the base of the skull through the vertex without intravenous contrast. COMPARISON:  02/11/2021 FINDINGS: Brain: Normal anatomic configuration. Parenchymal volume loss is commensurate with the patient's age. No abnormal intra or extra-axial mass lesion or fluid collection. No abnormal mass effect or midline shift. No evidence of acute intracranial hemorrhage or infarct. Ventricular size is normal. Cerebellum  unremarkable. Vascular: No asymmetric hyperdense vasculature at the skull base. Skull: Intact Sinuses/Orbits: Paranasal sinuses are clear. Orbits are unremarkable. Other: Mastoid air cells and middle ear cavities are clear. IMPRESSION: No acute intracranial abnormality.  Mild senescent change. Electronically Signed   By: Helyn Numbers M.D.   On: 04/18/2021 21:25   CT Angio Chest PE W and/or Wo Contrast  Result Date: 04/18/2021 CLINICAL DATA:  PE suspected, high prob EXAM: CT ANGIOGRAPHY CHEST WITH CONTRAST TECHNIQUE: Multidetector CT imaging of the chest was performed using the standard protocol during bolus administration of intravenous contrast. Multiplanar CT image reconstructions and MIPs were obtained to evaluate the vascular anatomy. CONTRAST:  37mL OMNIPAQUE IOHEXOL 350 MG/ML SOLN COMPARISON:  02/11/2021. FINDINGS: Cardiovascular: Satisfactory opacification of the pulmonary arteries to the segmental level. No evidence of pulmonary embolism. Normal heart size. No pericardial effusion. Mediastinum/Nodes: No enlarged mediastinal, hilar, or axillary lymph nodes. Thyroid gland, trachea, and esophagus demonstrate no significant findings. Lungs/Pleura: Similar bibasilar atelectasis/scar. No focal airspace consolidation. No pneumothorax. No pleural effusions. Upper Abdomen: Prior gastric bypass. No acute findings in the visualized upper abdomen. Musculoskeletal: Redemonstrated subacute appearing left rib fracture. Left shoulder arthroplasty. Review of the MIP images confirms the above findings. IMPRESSION: No evidence of acute pulmonary embolism or acute cardiopulmonary disease. Electronically Signed   By: Feliberto Harts M.D.   On: 04/18/2021 21:32  CT ABDOMEN PELVIS W CONTRAST  Result Date: 04/18/2021 CLINICAL DATA:  Acute abdominal pain.  Small-bowel obstruction. EXAM: CT ABDOMEN AND PELVIS WITH CONTRAST TECHNIQUE: Multidetector CT imaging of the abdomen and pelvis was performed using the standard  protocol following bolus administration of intravenous contrast. CONTRAST:  OMNIPAQUE IOHEXOL 300 MG/ML  SOLN COMPARISON:  X-ray abdomen 04/18/2021, CT abdomen pelvis 08/17/2020, CT abdomen pelvis 03/15/2021 FINDINGS: Lower chest: Linear atelectasis versus scarring within the lung bases. Hepatobiliary: Rib No focal liver abnormality. No gallstones, gallbladder wall thickening, or pericholecystic fluid. No biliary dilatation. Pancreas: Diffusely atrophic. No focal lesion. Otherwise normal pancreatic contour. No surrounding inflammatory changes. No main pancreatic ductal dilatation. Spleen: Normal in size without focal abnormality. Adrenals/Urinary Tract: No adrenal nodule bilaterally. Bilateral kidneys enhance symmetrically. Subcentimeter hypodensity too small to characterize. No hydronephrosis. No hydroureter. The urinary bladder is unremarkable. Stomach/Bowel: Roux-en-Y gastric bypass. Stomach is within normal limits. No evidence of bowel wall thickening or dilatation. The appendix not identified. Vascular/Lymphatic: No abdominal aorta or iliac aneurysm. Mild atherosclerotic plaque of the aorta and its branches. No abdominal, pelvic, or inguinal lymphadenopathy. Reproductive: Status post hysterectomy. No adnexal masses. Other: Trace free fluid within the pelvis. No intraperitoneal free gas. No organized fluid collection. Musculoskeletal: Ventral wall hernia repair with mesh.  Diastasis rectus. No suspicious lytic or blastic osseous lesions. No acute displaced fracture. Grade 1 anterolisthesis of L4 on L5. Multilevel degenerative changes of the spine. L4-L5 posterior interbody fusion. IMPRESSION: 1. 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 2. Persistent trace pelvic simple free fluid. Electronically Signed   By: Tish Frederickson M.D.   On: 04/18/2021 19:39   DG Abdomen Acute W/Chest  Result Date:  04/18/2021 CLINICAL DATA:  Nausea and vomiting, abdominal pain, recent small-bowel obstruction EXAM: DG ABDOMEN ACUTE WITH 1 VIEW CHEST COMPARISON:  03/19/2021 FINDINGS: Supine and upright frontal views of the abdomen and pelvis as well as an upright frontal view of the chest are obtained. The cardiac silhouette is unremarkable. No airspace disease, effusion, or pneumothorax. Extensive postsurgical changes from ventral hernia repair and lumbar fusion. Bowel gas pattern is unremarkable without obstruction or ileus. No masses or abnormal calcifications. No free gas in the greater peritoneal sac. No acute bony abnormalities. IMPRESSION: 1. Unremarkable bowel gas pattern. 2. No acute intrathoracic process. Electronically Signed   By: Sharlet Salina M.D.   On: 04/18/2021 17:23    Procedures Procedures   CRITICAL CARE Performed by: Richardean Canal   Total critical care time: 30  minutes  Critical care time was exclusive of separately billable procedures and treating other patients.  Critical care was necessary to treat or prevent imminent or life-threatening deterioration.  Critical care was time spent personally by me on the following activities: development of treatment plan with patient and/or surrogate as well as nursing, discussions with consultants, evaluation of patient's response to treatment, examination of patient, obtaining history from patient or surrogate, ordering and performing treatments and interventions, ordering and review of laboratory studies, ordering and review of radiographic studies, pulse oximetry and re-evaluation of patient's condition.   Medications Ordered in ED Medications  sodium chloride 0.9 % bolus 1,000 mL (has no administration in time range)  cefTRIAXone (ROCEPHIN) 2 g in sodium chloride 0.9 % 100 mL IVPB (has no administration in time range)  iohexol (OMNIPAQUE) 300 MG/ML solution 100 mL (100 mLs Intravenous Contrast Given 04/18/21 1924)  sodium chloride 0.9 %  bolus 1,000 mL (1,000 mLs Intravenous New Bag/Given 04/18/21 2134)  ondansetron (ZOFRAN) injection 4 mg (4 mg Intravenous Given 04/18/21 2135)  metoCLOPramide (REGLAN) injection 10 mg (10 mg Intravenous Given 04/18/21 2138)  diphenhydrAMINE (BENADRYL) injection 12.5 mg (12.5 mg Intravenous Given 04/18/21 2138)  iohexol (OMNIPAQUE) 350 MG/ML injection 75 mL (75 mLs Intravenous Contrast Given 04/18/21 2107)  morphine 4 MG/ML injection 4 mg (4 mg Intravenous Given 04/18/21 2209)    ED Course  I have reviewed the triage vital signs and the nursing notes.  Pertinent labs & imaging results that were available during my care of the patient were reviewed by me and considered in my medical decision making (see chart for details).    MDM Rules/Calculators/A&P                           Anthony MIKAL WISMAN is a 65 y.o. female here presenting with dizziness and palpitations.  Patient is in sinus tachycardia.  Concern for possible SBO versus PE versus dehydration versus pyelonephritis.  We will do CBC and CMP and lactate and cultures and urinalysis and CT chest and CT abdomen pelvis.  Also consider symptomatic hyperthyroidism  10:13 PM Patient's lactate is 3.6.  Urinalysis positive for UTI.  CT showed no SBO or obvious pyelonephritis.  Troponin borderline elevated likely demand ischemia from tachycardia.  Patient is still tachycardic with a heart rate in the 120s despite IV fluids.  Her TSH is slightly low at 0.25.  Unclear if this is hyperthyroidism versus sepsis from UTI.  Patient was given Rocephin.  Hospitalist to admit for sepsis.  We will hold off on beta-blockers    Final Clinical Impression(s) / ED Diagnoses Final diagnoses:  None    Rx / DC Orders ED Discharge Orders     None        Charlynne Pander, MD 04/18/21 2219    Charlynne Pander, MD 04/18/21 2219

## 2021-04-18 NOTE — ED Notes (Signed)
PATIENT SEEN AT [facility] DX WITH BOWEL OBSTRUCTION HAD NG PLACEMENT THEN DISCHARGED REPORTS STARTED HAVING ABD CHILLS AND WEAKNESS THAT STARTED LAST NIGHT.  TACHY IN TRIAGE IN 130;S

## 2021-04-18 NOTE — Telephone Encounter (Signed)
Pt call transferred to me from [facility].  Pt states she is very sick.  Shaking, lightheaded, no fevers, syncopal episodes, n/v.  No other symptoms.  Pt states she is home alone.  I advised to call 911.

## 2021-04-18 NOTE — ED Triage Notes (Signed)
Pt arrived by ems from home. Reports onset of weakness and feeling lightheaded since 8am. Received zofran 4mg  pta.

## 2021-04-18 NOTE — ED Provider Notes (Signed)
Emergency Medicine Provider Triage Evaluation Note  Brenda Mooney , a 65 y.o. female  was evaluated in triage.  Pt complains of nausea vomiting abdominal pain not feeling lightheaded since 8 AM her abdominal pain nausea vomiting have been ongoing since she was diagnosed with an SBO she was admitted to outside hospital discharged on the first of the month states that her symptoms have now resolved.  Review of Systems  Positive: Abdominal pain, nausea, vomiting Negative: Chest pain  Physical Exam  BP 111/81 (BP Location: Right Arm)   Pulse (!) 130   Temp 99.1 F (37.3 C) (Oral)   Resp 16   Ht 5\' 5"  (1.651 m)   Wt 70.3 kg   SpO2 100%   BMI 25.79 kg/m  Gen:   Awake, no distress   Resp:  Normal effort  MSK:   Moves extremities without difficulty  Other:  Abdomen is tender patient is voluntarily guarding.  There is no focal abdominal tenderness.  Medical Decision Making  Medically screening exam initiated at 4:33 PM.  Appropriate orders placed.  Brenda Mooney was informed that the remainder of the evaluation will be completed by another provider, this initial triage assessment does not replace that evaluation, and the importance of remaining in the ED until their evaluation is complete.  CT abd pelvis w contrast Labs covid and ekg obtained  Very tender to palpation concern for SBO.  Lower suspicion for perforation as patient is voluntarily guarding is difficult to examine.  Will obtain xray screen since CT may be delayed.    Annia Friendly [place]ard, DOLE 04/18/21 1638    06/18/21, MD 04/19/21 2225

## 2021-04-19 ENCOUNTER — Encounter (HOSPITAL_COMMUNITY): Payer: Self-pay | Admitting: Internal Medicine

## 2021-04-19 ENCOUNTER — Observation Stay (HOSPITAL_COMMUNITY): Payer: Medicare Other

## 2021-04-19 DIAGNOSIS — Z9884 Bariatric surgery status: Secondary | ICD-10-CM | POA: Diagnosis not present

## 2021-04-19 DIAGNOSIS — R778 Other specified abnormalities of plasma proteins: Secondary | ICD-10-CM | POA: Diagnosis present

## 2021-04-19 DIAGNOSIS — R109 Unspecified abdominal pain: Secondary | ICD-10-CM | POA: Diagnosis present

## 2021-04-19 DIAGNOSIS — K219 Gastro-esophageal reflux disease without esophagitis: Secondary | ICD-10-CM | POA: Diagnosis present

## 2021-04-19 DIAGNOSIS — R1084 Generalized abdominal pain: Secondary | ICD-10-CM | POA: Diagnosis not present

## 2021-04-19 DIAGNOSIS — F32A Depression, unspecified: Secondary | ICD-10-CM | POA: Diagnosis present

## 2021-04-19 DIAGNOSIS — M1990 Unspecified osteoarthritis, unspecified site: Secondary | ICD-10-CM | POA: Diagnosis present

## 2021-04-19 DIAGNOSIS — I10 Essential (primary) hypertension: Secondary | ICD-10-CM

## 2021-04-19 DIAGNOSIS — N3281 Overactive bladder: Secondary | ICD-10-CM | POA: Diagnosis present

## 2021-04-19 DIAGNOSIS — G2581 Restless legs syndrome: Secondary | ICD-10-CM | POA: Diagnosis present

## 2021-04-19 DIAGNOSIS — R17 Unspecified jaundice: Secondary | ICD-10-CM | POA: Diagnosis present

## 2021-04-19 DIAGNOSIS — M810 Age-related osteoporosis without current pathological fracture: Secondary | ICD-10-CM | POA: Diagnosis present

## 2021-04-19 DIAGNOSIS — Z20822 Contact with and (suspected) exposure to covid-19: Secondary | ICD-10-CM | POA: Diagnosis present

## 2021-04-19 DIAGNOSIS — E039 Hypothyroidism, unspecified: Secondary | ICD-10-CM | POA: Diagnosis present

## 2021-04-19 DIAGNOSIS — E872 Acidosis, unspecified: Secondary | ICD-10-CM

## 2021-04-19 DIAGNOSIS — M5030 Other cervical disc degeneration, unspecified cervical region: Secondary | ICD-10-CM | POA: Diagnosis present

## 2021-04-19 DIAGNOSIS — R112 Nausea with vomiting, unspecified: Secondary | ICD-10-CM | POA: Diagnosis present

## 2021-04-19 DIAGNOSIS — E876 Hypokalemia: Secondary | ICD-10-CM

## 2021-04-19 DIAGNOSIS — E0580 Other thyrotoxicosis without thyrotoxic crisis or storm: Principal | ICD-10-CM

## 2021-04-19 DIAGNOSIS — E43 Unspecified severe protein-calorie malnutrition: Secondary | ICD-10-CM | POA: Diagnosis present

## 2021-04-19 DIAGNOSIS — R000 Tachycardia, unspecified: Secondary | ICD-10-CM | POA: Diagnosis present

## 2021-04-19 DIAGNOSIS — D509 Iron deficiency anemia, unspecified: Secondary | ICD-10-CM | POA: Diagnosis present

## 2021-04-19 DIAGNOSIS — R634 Abnormal weight loss: Secondary | ICD-10-CM

## 2021-04-19 DIAGNOSIS — E860 Dehydration: Secondary | ICD-10-CM | POA: Diagnosis present

## 2021-04-19 DIAGNOSIS — F419 Anxiety disorder, unspecified: Secondary | ICD-10-CM | POA: Diagnosis present

## 2021-04-19 DIAGNOSIS — R1012 Left upper quadrant pain: Secondary | ICD-10-CM | POA: Diagnosis present

## 2021-04-19 DIAGNOSIS — N390 Urinary tract infection, site not specified: Secondary | ICD-10-CM | POA: Diagnosis present

## 2021-04-19 LAB — CBC WITH DIFFERENTIAL/PLATELET
Abs Immature Granulocytes: 0.01 10*3/uL (ref 0.00–0.07)
Basophils Absolute: 0 10*3/uL (ref 0.0–0.1)
Basophils Relative: 1 %
Eosinophils Absolute: 0.1 10*3/uL (ref 0.0–0.5)
Eosinophils Relative: 1 %
HCT: 28.6 % — ABNORMAL LOW (ref 36.0–46.0)
Hemoglobin: 9.5 g/dL — ABNORMAL LOW (ref 12.0–15.0)
Immature Granulocytes: 0 %
Lymphocytes Relative: 45 %
Lymphs Abs: 1.9 10*3/uL (ref 0.7–4.0)
MCH: 32.8 pg (ref 26.0–34.0)
MCHC: 33.2 g/dL (ref 30.0–36.0)
MCV: 98.6 fL (ref 80.0–100.0)
Monocytes Absolute: 0.4 10*3/uL (ref 0.1–1.0)
Monocytes Relative: 10 %
Neutro Abs: 1.8 10*3/uL (ref 1.7–7.7)
Neutrophils Relative %: 43 %
Platelets: 171 10*3/uL (ref 150–400)
RBC: 2.9 MIL/uL — ABNORMAL LOW (ref 3.87–5.11)
RDW: 13.7 % (ref 11.5–15.5)
WBC: 4.2 10*3/uL (ref 4.0–10.5)
nRBC: 0 % (ref 0.0–0.2)

## 2021-04-19 LAB — COMPREHENSIVE METABOLIC PANEL
ALT: 20 U/L (ref 0–44)
AST: 27 U/L (ref 15–41)
Albumin: 1.7 g/dL — ABNORMAL LOW (ref 3.5–5.0)
Alkaline Phosphatase: 91 U/L (ref 38–126)
Anion gap: 8 (ref 5–15)
BUN: <5 mg/dL — ABNORMAL LOW (ref 8–23)
CO2: 23 mmol/L (ref 22–32)
Calcium: 7.8 mg/dL — ABNORMAL LOW (ref 8.9–10.3)
Chloride: 109 mmol/L (ref 98–111)
Creatinine, Ser: 0.84 mg/dL (ref 0.44–1.00)
GFR, Estimated: >60 mL/min (ref >60)
Glucose, Bld: 91 mg/dL (ref 70–99)
Potassium: 3 mmol/L — ABNORMAL LOW (ref 3.5–5.1)
Sodium: 140 mmol/L (ref 135–145)
Total Bilirubin: 0.8 mg/dL (ref 0.3–1.2)
Total Protein: 4.2 g/dL — ABNORMAL LOW (ref 6.5–8.1)

## 2021-04-19 LAB — MAGNESIUM: Magnesium: 1.6 mg/dL — ABNORMAL LOW (ref 1.7–2.4)

## 2021-04-19 LAB — LACTIC ACID, PLASMA: Lactic Acid, Venous: 1 mmol/L (ref 0.5–1.9)

## 2021-04-19 LAB — PROCALCITONIN: Procalcitonin: 0.18 ng/mL

## 2021-04-19 LAB — TROPONIN I (HIGH SENSITIVITY): Troponin I (High Sensitivity): 74 ng/L — ABNORMAL HIGH (ref <18)

## 2021-04-19 LAB — HIV ANTIBODY (ROUTINE TESTING W REFLEX): HIV Screen 4th Generation wRfx: NONREACTIVE

## 2021-04-19 MED ORDER — ACETAMINOPHEN 650 MG RE SUPP: 650.0000 mg | Freq: Four times a day (QID) | RECTAL | Status: DC | PRN

## 2021-04-19 MED ORDER — PRAMIPEXOLE DIHYDROCHLORIDE 1 MG PO TABS
1.5000 mg | ORAL_TABLET | Freq: Every day | ORAL | Status: DC
  Administered 2021-04-19 – 2021-04-20 (×2): 1.5 mg via ORAL
  Filled 2021-04-19: qty 1
  Filled 2021-04-19: qty 2

## 2021-04-19 MED ORDER — LACTATED RINGERS IV SOLN
INTRAVENOUS | Status: DC
Start: 2021-04-19 — End: 2021-04-20
  Filled 2021-04-19 (×5): qty 1000

## 2021-04-19 MED ORDER — POLYETHYLENE GLYCOL 3350 17 G PO PACK: 17.0000 g | PACK | Freq: Every day | ORAL | Status: DC | PRN

## 2021-04-19 MED ORDER — HYDROXYZINE HCL 25 MG PO TABS: 25.0000 mg | ORAL_TABLET | Freq: Three times a day (TID) | ORAL | Status: DC | PRN

## 2021-04-19 MED ORDER — LEVOTHYROXINE SODIUM 75 MCG PO TABS
75.0000 ug | ORAL_TABLET | Freq: Every day | ORAL | Status: DC
  Administered 2021-04-19 – 2021-04-20 (×2): 75 ug via ORAL
  Filled 2021-04-19 (×2): qty 1

## 2021-04-19 MED ORDER — POTASSIUM CHLORIDE CRYS ER 20 MEQ PO TBCR
40.0000 meq | EXTENDED_RELEASE_TABLET | Freq: Two times a day (BID) | ORAL | Status: AC
  Administered 2021-04-19 (×2): 40 meq via ORAL
  Filled 2021-04-19 (×2): qty 2

## 2021-04-19 MED ORDER — ONDANSETRON HCL 4 MG/2ML IJ SOLN
4.0000 mg | Freq: Four times a day (QID) | INTRAMUSCULAR | Status: DC | PRN
  Administered 2021-04-19: 4 mg via INTRAVENOUS
  Filled 2021-04-19: qty 2

## 2021-04-19 MED ORDER — ARIPIPRAZOLE 5 MG PO TABS
10.0000 mg | ORAL_TABLET | Freq: Every day | ORAL | Status: DC
  Administered 2021-04-19 – 2021-04-20 (×2): 10 mg via ORAL
  Filled 2021-04-19: qty 1
  Filled 2021-04-19: qty 2

## 2021-04-19 MED ORDER — ENOXAPARIN SODIUM 40 MG/0.4ML IJ SOSY
40.0000 mg | PREFILLED_SYRINGE | INTRAMUSCULAR | Status: DC
  Administered 2021-04-19 – 2021-04-20 (×2): 40 mg via SUBCUTANEOUS
  Filled 2021-04-19 (×2): qty 0.4

## 2021-04-19 MED ORDER — MORPHINE SULFATE (PF) 2 MG/ML IV SOLN
2.0000 mg | INTRAVENOUS | Status: DC | PRN
  Administered 2021-04-19 – 2021-04-20 (×2): 2 mg via INTRAVENOUS
  Filled 2021-04-19 (×2): qty 1

## 2021-04-19 MED ORDER — FLUTICASONE PROPIONATE 50 MCG/ACT NA SUSP
1.0000 | Freq: Every day | NASAL | Status: DC
  Administered 2021-04-19: 1 via NASAL
  Filled 2021-04-19: qty 16

## 2021-04-19 MED ORDER — BUPROPION HCL ER (SR) 150 MG PO TB12
150.0000 mg | ORAL_TABLET | Freq: Every day | ORAL | Status: DC
  Administered 2021-04-19 – 2021-04-20 (×2): 150 mg via ORAL
  Filled 2021-04-19 (×2): qty 1

## 2021-04-19 MED ORDER — DARIFENACIN HYDROBROMIDE ER 7.5 MG PO TB24
7.5000 mg | ORAL_TABLET | Freq: Every day | ORAL | Status: DC
  Administered 2021-04-19 – 2021-04-20 (×2): 7.5 mg via ORAL
  Filled 2021-04-19 (×2): qty 1

## 2021-04-19 MED ORDER — ENSURE ENLIVE PO LIQD: 237.0000 mL | Freq: Two times a day (BID) | ORAL | Status: DC

## 2021-04-19 MED ORDER — ACETAMINOPHEN 325 MG PO TABS: 650.0000 mg | ORAL_TABLET | Freq: Four times a day (QID) | ORAL | Status: DC | PRN

## 2021-04-19 MED ORDER — ONDANSETRON HCL 4 MG PO TABS: 4.0000 mg | ORAL_TABLET | Freq: Four times a day (QID) | ORAL | Status: DC | PRN

## 2021-04-19 MED ORDER — ASPIRIN EC 81 MG PO TBEC
81.0000 mg | DELAYED_RELEASE_TABLET | Freq: Every day | ORAL | Status: DC
  Administered 2021-04-19 – 2021-04-20 (×2): 81 mg via ORAL
  Filled 2021-04-19 (×2): qty 1

## 2021-04-19 MED ORDER — MORPHINE SULFATE (PF) 4 MG/ML IV SOLN
4.0000 mg | INTRAVENOUS | Status: DC | PRN
  Administered 2021-04-19: 4 mg via INTRAVENOUS
  Filled 2021-04-19: qty 1

## 2021-04-19 MED ORDER — METOPROLOL TARTRATE 50 MG PO TABS
50.0000 mg | ORAL_TABLET | Freq: Two times a day (BID) | ORAL | Status: DC
  Administered 2021-04-19 – 2021-04-20 (×3): 50 mg via ORAL
  Filled 2021-04-19: qty 2
  Filled 2021-04-19 (×2): qty 1

## 2021-04-19 MED ORDER — TRAZODONE HCL 100 MG PO TABS
100.0000 mg | ORAL_TABLET | Freq: Every day | ORAL | Status: DC
  Administered 2021-04-19: 100 mg via ORAL
  Filled 2021-04-19: qty 1

## 2021-04-19 MED ORDER — DULOXETINE HCL 20 MG PO CPEP
20.0000 mg | ORAL_CAPSULE | Freq: Every day | ORAL | Status: DC
  Administered 2021-04-19: 20 mg via ORAL
  Filled 2021-04-19 (×2): qty 1

## 2021-04-19 MED ORDER — ATORVASTATIN CALCIUM 10 MG PO TABS
20.0000 mg | ORAL_TABLET | Freq: Every day | ORAL | Status: DC
  Administered 2021-04-19 – 2021-04-20 (×2): 20 mg via ORAL
  Filled 2021-04-19 (×2): qty 2

## 2021-04-19 NOTE — Progress Notes (Signed)
  INTERVAL PROGRESS NOTE    [name]- 65 y.o. female  LOS: 0 __________________________________________________________________  SUBJECTIVE: Admitted 04/18/2021 with cc of  Chief Complaint  Patient presents with   Weakness  Patient states that she has minimal pain at this time. Her nausea and vomiting have resolved.  Since admission, she has been improving.  OBJECTIVE: Blood pressure 118/67, pulse (!) 129, temperature 98.7 F (37.1 C), temperature source Oral, resp. rate 14, height 5\' 5"  (1.651 m), weight 70.3 kg, SpO2 96 %.  General: NAD, pleasant, able to participate in exam Cardiac: RRR, normal heart sounds, no murmurs. 2+ radial and PT pulses bilaterally Respiratory: CTAB, normal effort, No wheezes, rales or rhonchi Abdomen: soft, nontender, nondistended, no hepatic or splenomegaly, +BS Extremities: no edema. WWP. Skin: warm and dry, no rashes noted Neuro: alert and oriented, no focal deficits Psych: Normal affect and mood   ASSESSMENT/PLAN:  I have reviewed the full H&P by Dr. , and I agree with the assessment and plan as outlined therein. In addition: Hypokalemia- replacing with oral Kdur Leafy Half x2 on top of IV fluid replacement - BMP am  Her weight loss is likely related to her bariatric surgery (2019). Would recommend follow up with her surgeon.   Principal Problem:   Abdominal pain Active Problems:   GERD without esophagitis   Essential hypertension   Sinus tachycardia   Unintentional weight loss   Iatrogenic hyperthyroidism   Hypokalemia, inadequate intake   Elevated troponin level not due myocardial infarction   Lactic acidosis   Severe protein-calorie malnutrition (HCC)    01-15-1999, DO [facility] 04/19/2021, 12:02 PM    [email or web address] Available by Epic secure chat 7AM-7PM. If 7PM-7AM, please contact night-coverage   No Charge

## 2021-04-19 NOTE — Assessment & Plan Note (Signed)
   Patient suffered a 100 pound weight loss in the past 4 months  Physical exam reveals poor muscle tone and skin sagging all concerning for severe protein calorie malnutrition.  Nutrition consultation placed  Encouraging oral intake

## 2021-04-19 NOTE — Assessment & Plan Note (Signed)
   Replacing with intravenous potassium chloride  Obtaining magnesium level  Monitoring electrolytes with serial chemistries

## 2021-04-19 NOTE — Assessment & Plan Note (Signed)
   Resume home regimen of metoprolol  As needed antihypertensives for markedly elevated blood pressures.

## 2021-04-19 NOTE — Assessment & Plan Note (Signed)
   Patient presenting with substantial sinus tachycardia throughout the entire emergency department stay with heart rates averaging in the 120s.  Likely combination of severe volume depletion with superimposed iatrogenic hyperthyroidism and pain  Hydrating patient with intravenous isotonic fluids as patient is profoundly volume depleted  Monitoring patient on telemetry  As needed opiate-based analgesics for pain control

## 2021-04-19 NOTE — Assessment & Plan Note (Signed)
   Daily PPI 

## 2021-04-19 NOTE — H&P (Addendum)
History and Physical    Brenda Mooney PHK:[id number] DOB: June 16, 1956 DOA: 04/18/2021  PCP: Olive Bass, MD  Patient coming from: Home   Chief Complaint:  Chief Complaint  Patient presents with   Weakness     HPI:    65 year old female with past medical history of hypertension, hypothyroidism, restless leg syndrome, depression, anxiety disorder who presents to [facility] emergency department with abdominal pain and inability to tolerate oral intake.  Patient is planes that approximately 4 months ago she began to develop abdominal pain.  Initially this abdominal pain was mild in intensity.  Pain seemed to originate in the left lower quadrant but will radiate diffusely.  Pain was described as both "sharp and dull" in quality, worse with movement and worse with attempting to ingest food.  Patient denies any associated diarrhea or constipation.  The month, patient experienced a decreasing appetite and rapid weight loss.  Patient explained that anytime she attempted to eat certain solid foods she would become nauseated, gag and frequently vomit.  Patient's symptoms continue to worsen she eventually presented to [facility] last week of September and was admitted for suspected small bowel obstruction.  After several days of conservative management patient was told that her small bowel obstruction resolved and was discharged home on 10/1.  Patient explains that in the days that followed she felt no better, continued to experience now severe abdominal pain with ongoing nausea and frequent bouts of vomiting with inability to tolerate oral intake.  Due to patient's progressively worsening symptoms he developed progressively worsening weakness and eventually presented to [facility] emergency department for evaluation.  Upon evaluation in the emergency department patient was found to exhibit substantial tachycardia, lactic acidosis of 3.6 and clinical evidence of profound  dehydration.  Patient was initiated on intravenous fluids and was provided with 2 L of lactated Ringer solution.  Patient was additionally found to have a low TSH of 0.215 as well as minimally elevated troponins of 38 and 56.  ER provider felt that the urinalysis may be suggestive of a urinary tract infection and therefore gave a dose of intravenous ceftriaxone.  The hospitalist group was then called to assess the patient for admission to the hospital.  Review of Systems:   Review of Systems  Constitutional:  Positive for malaise/fatigue and weight loss.  Gastrointestinal:  Positive for abdominal pain, nausea and vomiting.  Neurological:  Positive for weakness.  All other systems reviewed and are negative.  Past Medical History:  Diagnosis Date   Anxiety    Arthritis    CKD (chronic kidney disease), stage III (HCC)    DDD (degenerative disc disease), cervical    Depression    GERD (gastroesophageal reflux disease)    History of migraine headaches    History of non anemic vitamin B12 deficiency    Hx of ventricular tachycardia    Hypertension    Osteoporosis    Overactive bladder    SOB (shortness of breath) on exertion    Thyroid disease     Past Surgical History:  Procedure Laterality Date   APPENDECTOMY     BACK SURGERY     BOWEL RESECTION     CARPAL TUNNEL RELEASE Right    GASTRIC BYPASS     INDUCED ABORTION     X 2   REPLACEMENT TOTAL KNEE Right    SALPINGOOPHORECTOMY Left    TOTAL SHOULDER REPLACEMENT Left    UTERINE ABLATION     VAGINAL HYSTERECTOMY  WISDOM TOOTH EXTRACTION       reports that she has never smoked. She has never used smokeless tobacco. She reports that she does not currently use alcohol. She reports that she does not currently use drugs.  Allergies  Allergen Reactions   Cephalexin Hives   Levofloxacin Hives   Sulfamethoxazole Hives   Oxycodone     Other reaction(s): Hallucinations    Family History  Problem Relation Age of Onset    Colon cancer Mother    Heart attack Mother    Lung cancer Father    Breast cancer Maternal Aunt      Prior to Admission medications   Medication Sig Start Date End Date Taking? Authorizing Provider  alendronate (FOSAMAX) 70 MG tablet Take 70 mg by mouth once a week. sunday 11/06/18  Yes [provider]  ARIPiprazole (ABILIFY) 10 MG tablet Take 10 mg by mouth daily. 03/29/15  Yes [provider]  aspirin 81 MG EC tablet Take 1 tablet by mouth daily. 06/26/19  Yes [provider]  atenolol (TENORMIN) 50 MG tablet Take 50 mg by mouth 2 (two) times daily.   Yes [provider]  atorvastatin (LIPITOR) 20 MG tablet Take 20 mg by mouth daily. 10/24/19  Yes [provider]  buPROPion (WELLBUTRIN SR) 150 MG 12 hr tablet Take 150 mg by mouth daily.   Yes [provider]  Cholecalciferol 25 MCG (1000 UT) tablet Take 1,000 Units by mouth daily.   Yes [provider]  DULoxetine (CYMBALTA) 20 MG capsule Take 20 mg by mouth See admin instructions. TAKE ONE CAPSULE BY MOUTH EVERY NIGHT AT BEDTIME IN ADDITON TO THE DULOXETINE IN THE MORNING 02/03/21  Yes [provider]  ergocalciferol (VITAMIN D2) 1.25 MG (50000 UT) capsule Take 1 capsule by mouth once a week. 09/26/18  Yes [provider]  fluticasone (FLONASE) 50 MCG/ACT nasal spray 2 sprays by Each Nare route daily as needed for Allergies.   Yes [provider]  hydrOXYzine (ATARAX/VISTARIL) 25 MG tablet Take 25-50 mg by mouth 3 (three) times daily as needed for anxiety. 11/18/20  Yes [provider]  levothyroxine (SYNTHROID) 150 MCG tablet Take 150 mcg by mouth daily before breakfast. 02/10/19  Yes [provider]  metoprolol tartrate (LOPRESSOR) 50 MG tablet Take 50 mg by mouth 2 (two) times daily. 02/24/21  Yes [provider]  ondansetron (ZOFRAN) 4 MG tablet Take 4 mg by mouth every 8 (eight) hours as needed for nausea or vomiting. 03/25/21  Yes  [provider]  oxyCODONE-acetaminophen (PERCOCET/ROXICET) 5-325 MG tablet Take 1 tablet by mouth every 8 (eight) hours as needed. 03/31/21  Yes [provider]  pramipexole (MIRAPEX) 1.5 MG tablet Take 1.5 mg by mouth daily.   Yes [provider]  solifenacin (VESICARE) 10 MG tablet Take 10 mg by mouth daily. 01/30/20  Yes [provider]  traZODone (DESYREL) 100 MG tablet Take 100 mg by mouth at bedtime.   Yes [provider]  influenza vac split quadrivalent PF (FLUARIX) 0.5 ML injection ADM 0.5ML IM UTD 04/17/17   [provider]    Physical Exam: Vitals:   04/19/21 0130 04/19/21 0200 04/19/21 0230 04/19/21 0330  BP: (!) 115/52 (!) 120/57 (!) 114/54 (!) 100/48  Pulse: (!) 133 (!) 123 (!) 132 (!) 123  Resp: Temp:      TempSrc:      SpO2: 92% 92% 91% 91%  Weight:  Height:        Constitutional: Awake alert and oriented x3, no associated distress.   Skin: no rashes, no lesions, extremely poor skin turgor noted.  Excessive sagging of the skin noted. Eyes: Pupils are equally reactive to light.  No evidence of scleral icterus or conjunctival pallor.  ENMT: Dry mucous membranes noted.  Posterior pharynx clear of any exudate or lesions.   Neck: normal, supple, no masses, no thyromegaly.  No evidence of jugular venous distension.   Respiratory: clear to auscultation bilaterally, no wheezing, no crackles. Normal respiratory effort. No accessory muscle use.  Cardiovascular: Tachycardic and regular, no murmurs / rubs / gallops.  Notable distal bilateral lower extremity pitting edema. 2+ pedal pulses. No carotid bruits.  Chest:   Nontender without crepitus or deformity.   Back:   Nontender without crepitus or deformity. Abdomen: Notable generalized abdominal tenderness, worst in the right upper quadrant.  Abdomen is soft however.   No evidence of intra-abdominal masses.  Positive bowel sounds noted in all quadrants.    Musculoskeletal: Notable poor muscle tone.  No joint deformity upper and lower extremities. Good ROM, no contractures.  Neurologic: CN 2-12 grossly intact. Sensation intact.  Patient moving all 4 extremities spontaneously.  Patient is following all commands.  Patient is responsive to verbal stimuli.   Psychiatric: Patient exhibits depressed mood with flat affect.  Patient seems to possess insight as to their current situation.     Labs on Admission: I have personally reviewed following labs and imaging studies -   CBC: Recent Labs  Lab 04/13/21 0000 04/18/21 1644  WBC 3.3 6.0  NEUTROABS 2.31 3.2  HGB 12.0 11.8*  HCT 38 34.3*  MCV  --  94.2  PLT 108* 259   Basic Metabolic Panel: Recent Labs  Lab 04/13/21 0000 04/18/21 1644  NA 135* 136  K 3.2* 3.3*  CL 105 105  CO2 20 19*  GLUCOSE  --  106*  BUN 6 <5*  CREATININE 0.9 0.91  CALCIUM 8.3* 8.7*   GFR: Estimated Creatinine Clearance: 61.4 mL/min (by C-G formula based on SCr of 0.91 mg/dL). Liver Function Tests: Recent Labs  Lab 04/13/21 0000 04/18/21 1644  AST 49* 43*  ALT 26 26  ALKPHOS 152* 123  BILITOT  --  1.8*  PROT  --  5.6*  ALBUMIN 2.7* 2.1*   Recent Labs  Lab 04/18/21 1644  LIPASE 24   No results for input(s): AMMONIA in the last 168 hours. Coagulation Profile: No results for input(s): INR, PROTIME in the last 168 hours. Cardiac Enzymes: No results for input(s): CKTOTAL, CKMB, CKMBINDEX, TROPONINI in the last 168 hours. BNP (last 3 results) No results for input(s): PROBNP in the last 8760 hours. HbA1C: No results for input(s): HGBA1C in the last 72 hours. CBG: No results for input(s): GLUCAP in the last 168 hours. Lipid Profile: No results for input(s): CHOL, HDL, LDLCALC, TRIG, CHOLHDL, LDLDIRECT in the last 72 hours. Thyroid Function Tests: Recent Labs    04/18/21 2042  TSH 0.215*  FREET4 2.62*   Anemia Panel: No results for input(s): VITAMINB12, FOLATE, FERRITIN, TIBC, IRON, RETICCTPCT  in the last 72 hours. Urine analysis:    Component Value Date/Time   COLORURINE YELLOW 04/18/2021 1629   APPEARANCEUR HAZY (A) 04/18/2021 1629   LABSPEC 1.024 04/18/2021 1629   PHURINE 7.0 04/18/2021 1629   GLUCOSEU NEGATIVE 04/18/2021 1629   HGBUR NEGATIVE 04/18/2021 1629   BILIRUBINUR NEGATIVE 04/18/2021 1629   KETONESUR NEGATIVE 04/18/2021 1629  PROTEINUR NEGATIVE 04/18/2021 1629   NITRITE NEGATIVE 04/18/2021 1629   LEUKOCYTESUR LARGE (A) 04/18/2021 1629    Radiological Exams on Admission - Personally Reviewed: CT HEAD WO CONTRAST ( )  Result Date: 04/18/2021 CLINICAL DATA:  Dizziness, non-specific EXAM: CT HEAD WITHOUT CONTRAST TECHNIQUE: Contiguous axial images were obtained from the base of the skull through the vertex without intravenous contrast. COMPARISON:  02/11/2021 FINDINGS: Brain: Normal anatomic configuration. Parenchymal volume loss is commensurate with the patient's age. No abnormal intra or extra-axial mass lesion or fluid collection. No abnormal mass effect or midline shift. No evidence of acute intracranial hemorrhage or infarct. Ventricular size is normal. Cerebellum unremarkable. Vascular: No asymmetric hyperdense vasculature at the skull base. Skull: Intact Sinuses/Orbits: Paranasal sinuses are clear. Orbits are unremarkable. Other: Mastoid air cells and middle ear cavities are clear. IMPRESSION: No acute intracranial abnormality.  Mild senescent change. Electronically Signed   By: Helyn Numbers M.D.   On: 04/18/2021 21:25   CT Angio Chest PE W and/or Wo Contrast  Result Date: 04/18/2021 CLINICAL DATA:  PE suspected, high prob EXAM: CT ANGIOGRAPHY CHEST WITH CONTRAST TECHNIQUE: Multidetector CT imaging of the chest was performed using the standard protocol during bolus administration of intravenous contrast. Multiplanar CT image reconstructions and MIPs were obtained to evaluate the vascular anatomy. CONTRAST:  66mL OMNIPAQUE IOHEXOL 350 MG/ML SOLN COMPARISON:   02/11/2021. FINDINGS: Cardiovascular: Satisfactory opacification of the pulmonary arteries to the segmental level. No evidence of pulmonary embolism. Normal heart size. No pericardial effusion. Mediastinum/Nodes: No enlarged mediastinal, hilar, or axillary lymph nodes. Thyroid gland, trachea, and esophagus demonstrate no significant findings. Lungs/Pleura: Similar bibasilar atelectasis/scar. No focal airspace consolidation. No pneumothorax. No pleural effusions. Upper Abdomen: Prior gastric bypass. No acute findings in the visualized upper abdomen. Musculoskeletal: Redemonstrated subacute appearing left rib fracture. Left shoulder arthroplasty. Review of the MIP images confirms the above findings. IMPRESSION: No evidence of acute pulmonary embolism or acute cardiopulmonary disease. Electronically Signed   By: Feliberto Harts M.D.   On: 04/18/2021 21:32   CT ABDOMEN PELVIS W CONTRAST  Result Date: 04/18/2021 CLINICAL DATA:  Acute abdominal pain.  Small-bowel obstruction. EXAM: CT ABDOMEN AND PELVIS WITH CONTRAST TECHNIQUE: Multidetector CT imaging of the abdomen and pelvis was performed using the standard protocol following bolus administration of intravenous contrast. CONTRAST:  OMNIPAQUE IOHEXOL 300 MG/ML  SOLN COMPARISON:  X-ray abdomen 04/18/2021, CT abdomen pelvis 08/17/2020, CT abdomen pelvis 03/15/2021 FINDINGS: Lower chest: Linear atelectasis versus scarring within the lung bases. Hepatobiliary: Rib No focal liver abnormality. No gallstones, gallbladder wall thickening, or pericholecystic fluid. No biliary dilatation. Pancreas: Diffusely atrophic. No focal lesion. Otherwise normal pancreatic contour. No surrounding inflammatory changes. No main pancreatic ductal dilatation. Spleen: Normal in size without focal abnormality. Adrenals/Urinary Tract: No adrenal nodule bilaterally. Bilateral kidneys enhance symmetrically. Subcentimeter hypodensity too small to characterize. No hydronephrosis. No  hydroureter. The urinary bladder is unremarkable. Stomach/Bowel: Roux-en-Y gastric bypass. Stomach is within normal limits. No evidence of bowel wall thickening or dilatation. The appendix not identified. Vascular/Lymphatic: No abdominal aorta or iliac aneurysm. Mild atherosclerotic plaque of the aorta and its branches. No abdominal, pelvic, or inguinal lymphadenopathy. Reproductive: Status post hysterectomy. No adnexal masses. Other: Trace free fluid within the pelvis. No intraperitoneal free gas. No organized fluid collection. Musculoskeletal: Ventral wall hernia repair with mesh.  Diastasis rectus. No suspicious lytic or blastic osseous lesions. No acute displaced fracture. Grade 1 anterolisthesis of L4 on L5. Multilevel degenerative changes of the spine. L4-L5 posterior interbody fusion. IMPRESSION:  1. 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 2. Persistent trace pelvic simple free fluid. Electronically Signed   By: Tish Frederickson M.D.   On: 04/18/2021 19:39   DG Abdomen Acute W/Chest  Result Date: 04/18/2021 CLINICAL DATA:  Nausea and vomiting, abdominal pain, recent small-bowel obstruction EXAM: DG ABDOMEN ACUTE WITH 1 VIEW CHEST COMPARISON:  03/19/2021 FINDINGS: Supine and upright frontal views of the abdomen and pelvis as well as an upright frontal view of the chest are obtained. The cardiac silhouette is unremarkable. No airspace disease, effusion, or pneumothorax. Extensive postsurgical changes from ventral hernia repair and lumbar fusion. Bowel gas pattern is unremarkable without obstruction or ileus. No masses or abnormal calcifications. No free gas in the greater peritoneal sac. No acute bony abnormalities. IMPRESSION: 1. Unremarkable bowel gas pattern. 2. No acute intrathoracic process. Electronically Signed   By: Sharlet Salina M.D.   On: 04/18/2021 17:23    EKG: Personally reviewed.  Rhythm is  sinus tachycardia with heart rate of 128 bpm.  No dynamic ST segment changes appreciated.  Assessment/Plan  * Abdominal pain, generalized with LUQ tenderness Patient states that she weighed 250 pounds 4 months ago and is currently 155 pounds today. This alarming degree of weight loss can be seen on physical examination the patient exhibiting extremely poor skin turgor and poor muscle tone Concern for underlying malignancy with severe protein calorie malnutrition Order placed for nutrition consultation Patient has already received CT imaging of the chest abdomen and pelvis which has been mostly unremarkable. Obtaining right upper quadrant ultrasound due to hyperbilirubinemia and right upper quadrant tenderness.  Patient may require endoscopic work-up as well as patient has never had a colonoscopy before.  Iatrogenic hyperthyroidism Patient presenting with substantial tachycardia and low TSH Considering patient has been taking her usual regimen of 150 micrograms of Synthroid daily all while losing over 100lbs patient is likely now suffering from iatrogenic hyperthyroidism Dramatically reducing regimen of Synthroid from 150 mcg daily to 75 mcg daily. Patient will need repeat thyroid testing in approximately 4 to 6 weeks.   Sinus tachycardia Patient presenting with substantial sinus tachycardia throughout the entire emergency department stay with heart rates averaging in the 120s. Likely combination of severe volume depletion with superimposed iatrogenic hyperthyroidism and pain Hydrating patient with intravenous isotonic fluids as patient is profoundly volume depleted Monitoring patient on telemetry As needed opiate-based analgesics for pain control  Lactic acidosis Substantial lactic acidosis likely primarily due to volume depletion Patient aggressively with intravenous isotonic fluids Performing serial lactic acid levels to ensure downtrending and resolution   Severe protein-calorie  malnutrition (HCC) Patient suffered a 100 pound weight loss in the past 4 months Physical exam reveals poor muscle tone and skin sagging all concerning for severe protein calorie malnutrition. Nutrition consultation placed Encouraging oral intake  Elevated troponin level not due myocardial infarction Slightly elevated troponins on arrival of 38 and 56 Patient is chest pain-free No evidence of dynamic ST segment change on EKG Monitoring patient on telemetry  Hypokalemia, inadequate intake Replacing with intravenous potassium chloride Obtaining magnesium level Monitoring electrolytes with serial chemistries  Essential hypertension Resume home regimen of metoprolol As needed antihypertensives for markedly elevated blood pressures.  GERD without esophagitis Daily PPI      Code Status:  Full code  code status decision has been confirmed with: Patient Family Communication: Deferred   Status is: Observation  The patient remains OBS appropriate and  will d/c before 2 midnights.  Dispo: The patient is from: Home              Anticipated d/c is to: Home              Patient currently is not medically stable to d/c.   Difficult to place patient No        Marinda Elk MD [facility] Pager [phone]  If 7PM-7AM, please contact night-coverage [email or web address] Use universal [place] password for that web site. If you do not have the password, please call the hospital operator.  04/19/2021, 5:10 AM

## 2021-04-19 NOTE — Assessment & Plan Note (Signed)
   Slightly elevated troponins on arrival of 38 and 56  Patient is chest pain-free  No evidence of dynamic ST segment change on EKG  Monitoring patient on telemetry

## 2021-04-19 NOTE — Assessment & Plan Note (Signed)
   Substantial lactic acidosis likely primarily due to volume depletion  Patient aggressively with intravenous isotonic fluids  Performing serial lactic acid levels to ensure downtrending and resolution

## 2021-04-19 NOTE — Plan of Care (Signed)
  Problem: Safety: Goal: Ability to remain free from injury will improve Outcome: Progressing   Problem: Skin Integrity: Goal: Risk for impaired skin integrity will decrease Outcome: Progressing   

## 2021-04-19 NOTE — Assessment & Plan Note (Deleted)
   Patient states that she weighed 250 pounds 4 months ago and is currently 155 pounds today.  This alarming degree of weight loss can be seen on physical examination the patient exhibiting extremely poor skin turgor and poor muscle tone  Concern for underlying malignancy with severe protein calorie malnutrition  Order placed for nutrition consultation  Patient has already received CT imaging of the chest abdomen and pelvis which has been mostly unremarkable.  Obtaining right upper quadrant ultrasound due to hyperbilirubinemia and right upper quadrant tenderness.  Patient may require endoscopic work-up as well as patient has never had a colonoscopy before. 

## 2021-04-19 NOTE — ED Notes (Signed)
Pt transported to US

## 2021-04-19 NOTE — Assessment & Plan Note (Signed)
   Patient states that she weighed 250 pounds 4 months ago and is currently 155 pounds today.  This alarming degree of weight loss can be seen on physical examination the patient exhibiting extremely poor skin turgor and poor muscle tone  Concern for underlying malignancy with severe protein calorie malnutrition  Order placed for nutrition consultation  Patient has already received CT imaging of the chest abdomen and pelvis which has been mostly unremarkable.  Obtaining right upper quadrant ultrasound due to hyperbilirubinemia and right upper quadrant tenderness.  Patient may require endoscopic work-up as well as patient has never had a colonoscopy before.

## 2021-04-19 NOTE — Assessment & Plan Note (Signed)
   Patient presenting with substantial tachycardia and low TSH  Considering patient has been taking her usual regimen of 150 micrograms of Synthroid daily all while losing over 100lbs patient is likely now suffering from iatrogenic hyperthyroidism  Dramatically reducing regimen of Synthroid from 150 mcg daily to 75 mcg daily.  Patient will need repeat thyroid testing in approximately 4 to 6 weeks.

## 2021-04-20 DIAGNOSIS — R778 Other specified abnormalities of plasma proteins: Secondary | ICD-10-CM | POA: Diagnosis not present

## 2021-04-20 DIAGNOSIS — I10 Essential (primary) hypertension: Secondary | ICD-10-CM | POA: Diagnosis not present

## 2021-04-20 DIAGNOSIS — Z9884 Bariatric surgery status: Secondary | ICD-10-CM

## 2021-04-20 DIAGNOSIS — R1084 Generalized abdominal pain: Secondary | ICD-10-CM | POA: Diagnosis not present

## 2021-04-20 LAB — BASIC METABOLIC PANEL
Anion gap: 4 — ABNORMAL LOW (ref 5–15)
BUN: <5 mg/dL — ABNORMAL LOW (ref 8–23)
CO2: 26 mmol/L (ref 22–32)
Calcium: 8.8 mg/dL — ABNORMAL LOW (ref 8.9–10.3)
Chloride: 109 mmol/L (ref 98–111)
Creatinine, Ser: 0.8 mg/dL (ref 0.44–1.00)
GFR, Estimated: >60 mL/min (ref >60)
Glucose, Bld: 77 mg/dL (ref 70–99)
Potassium: 5.3 mmol/L — ABNORMAL HIGH (ref 3.5–5.1)
Sodium: 139 mmol/L (ref 135–145)

## 2021-04-20 LAB — T3: T3, Total: 81 ng/dL (ref 71–180)

## 2021-04-20 MED ORDER — POLYETHYLENE GLYCOL 3350 17 G PO PACK: 17.0000 g | PACK | Freq: Every day | ORAL | 0 refills | Status: AC | PRN

## 2021-04-20 MED ORDER — PROCHLORPERAZINE MALEATE 5 MG PO TABS: 5.0000 mg | ORAL_TABLET | Freq: Four times a day (QID) | ORAL | 0 refills | Status: AC | PRN

## 2021-04-20 MED ORDER — TRAZODONE HCL 50 MG PO TABS
50.0000 mg | ORAL_TABLET | Freq: Every evening | ORAL | Status: DC | PRN
  Filled 2021-04-20: qty 1

## 2021-04-20 MED ORDER — LEVOTHYROXINE SODIUM 75 MCG PO TABS: 75.0000 ug | ORAL_TABLET | Freq: Every day | ORAL | 0 refills | Status: AC

## 2021-04-20 NOTE — Discharge Summary (Signed)
Physician Discharge Summary  Brenda Mooney GEX:[id number] DOB: 1955-09-24 DOA: 04/18/2021  PCP: Olive Bass, MD  Admit date: 04/18/2021 Discharge date: 04/20/2021  Admitted From: home Disposition:  home  Recommendations for Outpatient Follow-up:  Follow up with PCP within 1-2 weeks to test thyroid hormone as the dose was reduced during inpatient stay Follow up with new bariatric surgeon referral to discuss weight loss and bariatric surgery  Discharge Condition:stable, improved CODE STATUS:full Diet recommendation: regular  Brief/Interim Summary: Pt admitted for nausea, vomiting, abdominal pain and weight loss. Her workup including abdominal CT was overall unremarkable. Her TSH was low which could be related to her symptoms and a consequence of being on the same dose after an extensive weight loss. Her levothyroxine dose was decreased prior to discharge. She felt back to baseline with supportive care and discharged in stable condition. An ambulatory referral to bariatric surgery was made at discharge to follow up on her weight loss and surgery.   Discharge Diagnoses:  Principal Problem:   Abdominal pain Active Problems:   GERD without esophagitis   Essential hypertension   Sinus tachycardia   Unintentional weight loss   Iatrogenic hyperthyroidism   Hypokalemia, inadequate intake   Elevated troponin level not due myocardial infarction   Lactic acidosis   Severe protein-calorie malnutrition (HCC)   Discharge Instructions     Amb Referral to Bariatric Surgery   Complete by: As directed       Allergies as of 04/20/2021       Reactions   Cephalexin Hives   Levofloxacin Hives   Sulfamethoxazole Hives   Oxycodone    Other reaction(s): Hallucinations        Medication List     TAKE these medications    alendronate 70 MG tablet Commonly known as: FOSAMAX Take 70 mg by mouth once a week. sunday   ARIPiprazole 10 MG tablet Commonly known as: ABILIFY Take 10 mg  by mouth daily.   aspirin 81 MG EC tablet Take 1 tablet by mouth daily.   atenolol 50 MG tablet Commonly known as: TENORMIN Take 50 mg by mouth 2 (two) times daily.   atorvastatin 20 MG tablet Commonly known as: LIPITOR Take 20 mg by mouth daily.   buPROPion 150 MG 12 hr tablet Commonly known as: WELLBUTRIN SR Take 150 mg by mouth daily.   Cholecalciferol 25 MCG (1000 UT) tablet Take 1,000 Units by mouth daily.   DULoxetine 20 MG capsule Commonly known as: CYMBALTA Take 20 mg by mouth See admin instructions. TAKE ONE CAPSULE BY MOUTH EVERY NIGHT AT BEDTIME IN ADDITON TO THE DULOXETINE IN THE MORNING   ergocalciferol 1.25 MG (50000 UT) capsule Commonly known as: VITAMIN D2 Take 1 capsule by mouth once a week.   fluticasone 50 MCG/ACT nasal spray Commonly known as: FLONASE 2 sprays by Each Nare route daily as needed for Allergies.   hydrOXYzine 25 MG tablet Commonly known as: ATARAX/VISTARIL Take 25-50 mg by mouth 3 (three) times daily as needed for anxiety.   influenza vac split quadrivalent PF 0.5 ML injection Commonly known as: FLUARIX ADM 0.5ML IM UTD   levothyroxine 75 MCG tablet Commonly known as: SYNTHROID Take 1 tablet (75 mcg total) by mouth daily before breakfast. Start taking on: April 21, 2021 What changed:  medication strength how much to take   metoprolol tartrate 50 MG tablet Commonly known as: LOPRESSOR Take 50 mg by mouth 2 (two) times daily.   ondansetron 4 MG tablet Commonly known as:  ZOFRAN Take 4 mg by mouth every 8 (eight) hours as needed for nausea or vomiting.   oxyCODONE-acetaminophen 5-325 MG tablet Commonly known as: PERCOCET/ROXICET Take 1 tablet by mouth every 8 (eight) hours as needed.   polyethylene glycol 17 g packet Commonly known as: MIRALAX / GLYCOLAX Take 17 g by mouth daily as needed for mild constipation.   pramipexole 1.5 MG tablet Commonly known as: MIRAPEX Take 1.5 mg by mouth daily.   prochlorperazine 5 MG  tablet Commonly known as: COMPAZINE Take 1 tablet (5 mg total) by mouth every 6 (six) hours as needed for nausea or vomiting.   solifenacin 10 MG tablet Commonly known as: VESICARE Take 10 mg by mouth daily.   traZODone 100 MG tablet Commonly known as: DESYREL Take 100 mg by mouth at bedtime.        Allergies  Allergen Reactions   Cephalexin Hives   Levofloxacin Hives   Sulfamethoxazole Hives   Oxycodone     Other reaction(s): Hallucinations    Consultations: none  Procedures/Studies: CT HEAD WO CONTRAST ( )  Result Date: 04/18/2021 CLINICAL DATA:  Dizziness, non-specific EXAM: CT HEAD WITHOUT CONTRAST TECHNIQUE: Contiguous axial images were obtained from the base of the skull through the vertex without intravenous contrast. COMPARISON:  02/11/2021 FINDINGS: Brain: Normal anatomic configuration. Parenchymal volume loss is commensurate with the patient's age. No abnormal intra or extra-axial mass lesion or fluid collection. No abnormal mass effect or midline shift. No evidence of acute intracranial hemorrhage or infarct. Ventricular size is normal. Cerebellum unremarkable. Vascular: No asymmetric hyperdense vasculature at the skull base. Skull: Intact Sinuses/Orbits: Paranasal sinuses are clear. Orbits are unremarkable. Other: Mastoid air cells and middle ear cavities are clear. IMPRESSION: No acute intracranial abnormality.  Mild senescent change. Electronically Signed   By: Helyn Numbers M.D.   On: 04/18/2021 21:25   CT Angio Chest PE W and/or Wo Contrast  Result Date: 04/18/2021 CLINICAL DATA:  PE suspected, high prob EXAM: CT ANGIOGRAPHY CHEST WITH CONTRAST TECHNIQUE: Multidetector CT imaging of the chest was performed using the standard protocol during bolus administration of intravenous contrast. Multiplanar CT image reconstructions and MIPs were obtained to evaluate the vascular anatomy. CONTRAST:  85mL OMNIPAQUE IOHEXOL 350 MG/ML SOLN COMPARISON:  02/11/2021. FINDINGS:  Cardiovascular: Satisfactory opacification of the pulmonary arteries to the segmental level. No evidence of pulmonary embolism. Normal heart size. No pericardial effusion. Mediastinum/Nodes: No enlarged mediastinal, hilar, or axillary lymph nodes. Thyroid gland, trachea, and esophagus demonstrate no significant findings. Lungs/Pleura: Similar bibasilar atelectasis/scar. No focal airspace consolidation. No pneumothorax. No pleural effusions. Upper Abdomen: Prior gastric bypass. No acute findings in the visualized upper abdomen. Musculoskeletal: Redemonstrated subacute appearing left rib fracture. Left shoulder arthroplasty. Review of the MIP images confirms the above findings. IMPRESSION: No evidence of acute pulmonary embolism or acute cardiopulmonary disease. Electronically Signed   By: Feliberto Harts M.D.   On: 04/18/2021 21:32   CT ABDOMEN PELVIS W CONTRAST  Result Date: 04/18/2021 CLINICAL DATA:  Acute abdominal pain.  Small-bowel obstruction. EXAM: CT ABDOMEN AND PELVIS WITH CONTRAST TECHNIQUE: Multidetector CT imaging of the abdomen and pelvis was performed using the standard protocol following bolus administration of intravenous contrast. CONTRAST:  OMNIPAQUE IOHEXOL 300 MG/ML  SOLN COMPARISON:  X-ray abdomen 04/18/2021, CT abdomen pelvis 08/17/2020, CT abdomen pelvis 03/15/2021 FINDINGS: Lower chest: Linear atelectasis versus scarring within the lung bases. Hepatobiliary: Rib No focal liver abnormality. No gallstones, gallbladder wall thickening, or pericholecystic fluid. No biliary dilatation. Pancreas: Diffusely atrophic. No  focal lesion. Otherwise normal pancreatic contour. No surrounding inflammatory changes. No main pancreatic ductal dilatation. Spleen: Normal in size without focal abnormality. Adrenals/Urinary Tract: No adrenal nodule bilaterally. Bilateral kidneys enhance symmetrically. Subcentimeter hypodensity too small to characterize. No hydronephrosis. No hydroureter. The urinary  bladder is unremarkable. Stomach/Bowel: Roux-en-Y gastric bypass. Stomach is within normal limits. No evidence of bowel wall thickening or dilatation. The appendix not identified. Vascular/Lymphatic: No abdominal aorta or iliac aneurysm. Mild atherosclerotic plaque of the aorta and its branches. No abdominal, pelvic, or inguinal lymphadenopathy. Reproductive: Status post hysterectomy. No adnexal masses. Other: Trace free fluid within the pelvis. No intraperitoneal free gas. No organized fluid collection. Musculoskeletal: Ventral wall hernia repair with mesh.  Diastasis rectus. No suspicious lytic or blastic osseous lesions. No acute displaced fracture. Grade 1 anterolisthesis of L4 on L5. Multilevel degenerative changes of the spine. L4-L5 posterior interbody fusion. IMPRESSION: 1. 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 2. Persistent trace pelvic simple free fluid. Electronically Signed   By: Tish Frederickson M.D.   On: 04/18/2021 19:39   DG Abdomen Acute W/Chest  Result Date: 04/18/2021 CLINICAL DATA:  Nausea and vomiting, abdominal pain, recent small-bowel obstruction EXAM: DG ABDOMEN ACUTE WITH 1 VIEW CHEST COMPARISON:  03/19/2021 FINDINGS: Supine and upright frontal views of the abdomen and pelvis as well as an upright frontal view of the chest are obtained. The cardiac silhouette is unremarkable. No airspace disease, effusion, or pneumothorax. Extensive postsurgical changes from ventral hernia repair and lumbar fusion. Bowel gas pattern is unremarkable without obstruction or ileus. No masses or abnormal calcifications. No free gas in the greater peritoneal sac. No acute bony abnormalities. IMPRESSION: 1. Unremarkable bowel gas pattern. 2. No acute intrathoracic process. Electronically Signed   By: Sharlet Salina M.D.   On: 04/18/2021 17:23   US Abdomen Limited RUQ (LIVER/GB)  Result Date:  04/19/2021 CLINICAL DATA:  Hyperbilirubinemia EXAM: ULTRASOUND ABDOMEN LIMITED RIGHT UPPER QUADRANT COMPARISON:  Abdominal CT from yesterday FINDINGS: Gallbladder: Full gallbladder containing dependent sludge. No shadowing calculi or wall thickening. Common bile duct: Diameter: 6 mm. Liver: No focal lesion identified. Prominent echogenicity of the liver, possible mild steatosis. Portal vein is patent on color Doppler imaging with normal direction of blood flow towards the liver. IMPRESSION: Gallbladder sludge.  No evidence of cholecystitis. Electronically Signed   By: Tiburcio Pea M.D.   On: 04/19/2021 05:22    Subjective: Patient denies abdominal pain, Nausea, vomiting, diarrhea. She feels well and very ready to go home.  Discharge Exam: Vitals:   04/20/21 0021 04/20/21 0417  BP: (!) 106/59 129/68  Pulse: 79 88  Resp:  18  Temp:  98.1 F (36.7 C)  SpO2: 96% 96%    General: Pt is alert, awake, not in acute distress Cardiovascular: RRR, S1/S2 +, no rubs, no gallops Respiratory: CTA bilaterally, no wheezing, no rhonchi Abdominal: Soft, NT, ND, bowel sounds + Extremities: no edema, no cyanosis  Labs: Basic Metabolic Panel: Recent Labs  Lab 04/18/21 1644 04/19/21 0600 04/20/21 0415  NA 136 140 139  K 3.3* 3.0* 5.3*  CL 105 109 109  CO2 19* 23 26  GLUCOSE 106* 91 77  BUN <5* <5* <5*  CREATININE 0.91 0.84 0.80  CALCIUM 8.7* 7.8* 8.8*  MG  --  1.6*  --    CBC: Recent Labs  Lab 04/18/21 1644 04/19/21 0600  WBC 6.0 4.2  NEUTROABS 3.2 1.8  HGB 11.8*  9.5*  HCT 34.3* 28.6*  MCV 94.2 98.6  PLT 259 171    Microbiology Recent Results (from the past 240 hour(s))  Resp Panel by RT-PCR (Flu A&B, Covid) Nasopharyngeal Swab     Status: None   Collection Time: 04/18/21 10:00 PM   Specimen: Nasopharyngeal Swab; Nasopharyngeal(NP) swabs in vial transport medium  Result Value Ref Range Status   SARS Coronavirus 2 by RT PCR NEGATIVE NEGATIVE Final    Comment: (NOTE) SARS-CoV-2  target nucleic acids are NOT DETECTED.  The SARS-CoV-2 RNA is generally detectable in upper respiratory specimens during the acute phase of infection. The lowest concentration of SARS-CoV-2 viral copies this assay can detect is 138 copies/mL. A negative result does not preclude SARS-Cov-2 infection and should not be used as the sole basis for treatment or other patient management decisions. A negative result may occur with  improper specimen collection/handling, submission of specimen other than nasopharyngeal swab, presence of viral mutation(s) within the areas targeted by this assay, and inadequate number of viral copies(<138 copies/mL). A negative result must be combined with clinical observations, patient history, and epidemiological information. The expected result is Negative.  Fact Sheet for Patients:  BloggerCourse.com  Fact Sheet for Healthcare Providers:  SeriousBroker.it  This test is no t yet approved or cleared by the [place] FDA and  has been authorized for detection and/or diagnosis of SARS-CoV-2 by FDA under an Emergency Use Authorization (EUA). This EUA will remain  in effect (meaning this test can be used) for the duration of the COVID-19 declaration under Section 564(b)(1) of the Act, 21 U.S.C.section 360bbb-3(b)(1), unless the authorization is terminated  or revoked sooner.       Influenza A by PCR NEGATIVE NEGATIVE Final   Influenza B by PCR NEGATIVE NEGATIVE Final    Comment: (NOTE) The Xpert Xpress SARS-CoV-2/FLU/RSV plus assay is intended as an aid in the diagnosis of influenza from Nasopharyngeal swab specimens and should not be used as a sole basis for treatment. Nasal washings and aspirates are unacceptable for Xpert Xpress SARS-CoV-2/FLU/RSV testing.  Fact Sheet for Patients: BloggerCourse.com  Fact Sheet for Healthcare  Providers: SeriousBroker.it  This test is not yet approved or cleared by the [place] FDA and has been authorized for detection and/or diagnosis of SARS-CoV-2 by FDA under an Emergency Use Authorization (EUA). This EUA will remain in effect (meaning this test can be used) for the duration of the COVID-19 declaration under Section 564(b)(1) of the Act, 21 U.S.C. section 360bbb-3(b)(1), unless the authorization is terminated or revoked.  Performed at [facility], [address] [place], [place] [address]   Blood culture (routine x 2)     Status: None (Preliminary result)   Collection Time: 04/18/21 10:15 PM   Specimen: BLOOD  Result Value Ref Range Status   Specimen Description BLOOD LEFT ANTECUBITAL  Final   Special Requests   Final    BOTTLES DRAWN AEROBIC AND ANAEROBIC Blood Culture results may not be optimal due to an inadequate volume of blood received in culture bottles   Culture   Final    NO GROWTH 1 DAY Performed at [facility], [address], [place], [place] [address]    Report Status PENDING  Incomplete  Blood culture (routine x 2)     Status: None (Preliminary result)   Collection Time: 04/19/21  6:02 AM   Specimen: BLOOD  Result Value Ref Range Status   Specimen Description BLOOD RIGHT ANTECUBITAL  Final  Special Requests   Final    BOTTLES DRAWN AEROBIC AND ANAEROBIC Blood Culture adequate volume   Culture   Final    NO GROWTH 1 DAY Performed at [facility], [address], [place], [place] [address]    Report Status PENDING  Incomplete    Time coordinating discharge: Over 30 minutes  Leeroy Bock, MD  [facility] 04/20/2021, 6:33 PM Pager   If 7PM-7AM, please contact night-coverage [email or web address] Password [email or web address]

## 2021-04-20 NOTE — Care Management (Signed)
1005 04-20-21 Case Manager received consult for transportation assistance. Patient called [facility] and the ride will arrive at 1347 for transport home. Patient states she moves around good in the home and she will not need any home health physical therapy. No further needs from Case Manager at this time.

## 2021-04-21 NOTE — Progress Notes (Signed)
[facility]  [address] [place],  [place]  [address] [phone]  Clinic Day:  04/27/2021  Referring physician: Algis Greenhouse, MD  This document serves as a record of services personally performed by Byrne Capek Macarthur Critchley, MD. It was created on their behalf by [facility] E, a trained medical scribe. The creation of this record is based on the scribe's personal observations and the provider's statements to them.  HISTORY OF PRESENT ILLNESS:  The patient is a 65 y.o. female who I recently began seeing for macrocytic anemia.  However, labs at her initial visit showed a satisfactory hemoglobin of 12.  She comes in today to review all of her recent labs to determine if there is an underlying problem factoring into her low counts.  Of note, her white cells and platelets were also minimally low at her initial visit.  Overall, the patient claims to be doing okay.  She denies having increased fatigue or any overt forms of blood loss that concern her for progressive anemia.    PHYSICAL EXAM:  Blood pressure 139/74, pulse 92, temperature 97.9 F (36.6 C), resp. rate 16, height 5' 5"  (1.651 m), weight 149 lb 3.2 oz (67.7 kg), SpO2 96 %. Wt Readings from Last 3 Encounters:  04/27/21 149 lb 3.2 oz (67.7 kg)  04/18/21 155 lb (70.3 kg)  04/13/21 159 lb 12.8 oz (72.5 kg)   Body mass index is 24.83 kg/m. Performance status (ECOG): 1 - Symptomatic but completely ambulatory Physical Exam Constitutional:      Appearance: Normal appearance. She is ill-appearing (chronically ill appearance).     Comments: Ambulates with cane  HENT:     Mouth/Throat:     Mouth: Mucous membranes are moist.     Pharynx: Oropharynx is clear. No oropharyngeal exudate or posterior oropharyngeal erythema.  Cardiovascular:     Rate and Rhythm: Normal rate and regular rhythm.     Heart sounds: No murmur heard.   No friction rub. No gallop.  Pulmonary:     Effort: Pulmonary effort is  normal. No respiratory distress.     Breath sounds: Normal breath sounds. No wheezing, rhonchi or rales.  Abdominal:     General: Bowel sounds are normal. There is no distension.     Palpations: Abdomen is soft. There is no mass.     Tenderness: There is no abdominal tenderness.  Musculoskeletal:        General: No swelling.     Right lower leg: No edema.     Left lower leg: No edema.  Lymphadenopathy:     Cervical: No cervical adenopathy.     Upper Body:     Right upper body: No supraclavicular or axillary adenopathy.     Left upper body: No supraclavicular or axillary adenopathy.     Lower Body: No right inguinal adenopathy. No left inguinal adenopathy.  Skin:    General: Skin is warm.     Coloration: Skin is not jaundiced.     Findings: No lesion or rash.  Neurological:     General: No focal deficit present.     Mental Status: She is alert and oriented to person, place, and time. Mental status is at baseline.     Cranial Nerves: Cranial nerves are intact.  Psychiatric:        Mood and Affect: Mood normal.        Behavior: Behavior normal.        Thought Content: Thought content normal.  LABS:    CMP Latest Ref Rng & Units 04/20/2021 04/19/2021 04/18/2021  Glucose 70 - 99 mg/dL 77 91 106(H)  BUN 8 - 23 mg/dL <5(L) <5(L) <5(L)  Creatinine 0.44 - 1.00 mg/dL 0.80 0.84 0.91  Sodium 135 - 145 mmol/L 139 140 136  Potassium 3.5 - 5.1 mmol/L 5.3(H) 3.0(L) 3.3(L)  Chloride 98 - 111 mmol/L 109 109 105  CO2 22 - 32 mmol/L 26 23 19(L)  Calcium 8.9 - 10.3 mg/dL 8.8(L) 7.8(L) 8.7(L)  Total Protein 6.5 - 8.1 g/dL - 4.2(L) 5.6(L)  Total Bilirubin 0.3 - 1.2 mg/dL - 0.8 1.8(H)  Alkaline Phos 38 - 126 U/L - 91 123  AST 15 - 41 U/L - 27 43(H)  ALT 0 - 44 U/L - 20 26   Iron 28 - 170 ug/dL 71   TIBC 250 - 450 ug/dL 141 Low    Saturation Ratios 10.4 - 31.8 % 50 High    UIBC ug/dL 70    Ferritin 11 - 307 ng/mL 363 High     Transferrin Receptor 12.2 - 27.3 nmol/L 23.2    Vitamin  B-12 180 - 914 pg/mL >7,500 High     Folate >5.9 ng/mL 22.6   TSH 0.350 - 4.500 uIU/mL 1.070    Total Protein ELP 6.0 - 8.5 g/dL 5.4 Low    Albumin ELP 2.9 - 4.4 g/dL 2.7 Low    Alpha-1-Globulin 0.0 - 0.4 g/dL 0.2   Alpha-2-Globulin 0.4 - 1.0 g/dL 0.6   Beta Globulin 0.7 - 1.3 g/dL 0.7   Gamma Globulin 0.4 - 1.8 g/dL 1.2   M-Spike, % Not Observed g/dL Not Observed    Globulin, Total 2.2 - 3.9 g/dL 2.7 VC   A/G Ratio 0.7 - 1.7 1.0 VC    ASSESSMENT & PLAN:  A 65 y.o. female with macrocytic anemia.  When reviewing all of her recent labs, she does not have any nutritional deficiencies, thyroid disease, kidney disease, or multiple myeloma factoring into her anemia.  The patient understands that her hemoglobin is now low normal.  Her white cells and platelets, although low, are not dangerously low to where her cytopenias should impact her daily quality of life.  For now, her peripheral counts will be followed conservatively.  I will see her back in 6 months for repeat clinical assessment.  The patient understands all the plans discussed today and is in agreement with them.  I, Rita Ohara, am acting as scribe for Marice Potter, MD    I have reviewed this report as typed by the medical scribe, and it is complete and accurate.  Mael Delap Macarthur Critchley, MD

## 2021-04-24 LAB — CULTURE, BLOOD (ROUTINE X 2)
Culture: NO GROWTH
Culture: NO GROWTH
Special Requests: ADEQUATE

## 2021-04-27 ENCOUNTER — Inpatient Hospital Stay: Payer: Medicare Other

## 2021-04-27 ENCOUNTER — Other Ambulatory Visit: Payer: Self-pay | Admitting: Oncology

## 2021-04-27 ENCOUNTER — Inpatient Hospital Stay (INDEPENDENT_AMBULATORY_CARE_PROVIDER_SITE_OTHER): Payer: Medicare Other | Admitting: Oncology

## 2021-04-27 VITALS — BP 139/74 | HR 92 | Temp 97.9°F | Resp 16 | Ht 65.0 in | Wt 149.2 lb

## 2021-04-27 DIAGNOSIS — D539 Nutritional anemia, unspecified: Secondary | ICD-10-CM | POA: Diagnosis not present

## 2021-07-13 DIAGNOSIS — R600 Localized edema: Secondary | ICD-10-CM

## 2021-07-16 DIAGNOSIS — R000 Tachycardia, unspecified: Secondary | ICD-10-CM

## 2021-08-23 DIAGNOSIS — I361 Nonrheumatic tricuspid (valve) insufficiency: Secondary | ICD-10-CM | POA: Diagnosis not present

## 2021-09-07 DEATH — deceased

## 2021-10-26 ENCOUNTER — Ambulatory Visit: Payer: Medicare Other | Admitting: Oncology

## 2021-10-26 ENCOUNTER — Other Ambulatory Visit: Payer: Medicare Other

## 2021-10-26 NOTE — Progress Notes (Incomplete)
?[facility]  ?[address] ?[place],    [address] ?[phone] ? ?Clinic Day:  10/26/2021 ? ?Referring physician: Algis Greenhouse, MD ? ?This document serves as a record of services personally performed by Marice Potter, MD. It was created on their behalf by Curry,Lauren E, a trained medical scribe. The creation of this record is based on the scribe's personal observations and the provider's statements to them. ? ?HISTORY OF PRESENT ILLNESS:  ?The patient is a 66 y.o. female who I recently began seeing for macrocytic anemia.  However, labs at her initial visit showed a satisfactory hemoglobin of 12.  She comes in today to review all of her recent labs to determine if there is an underlying problem factoring into her low counts.  Of note, her white cells and platelets were also minimally low at her initial visit.  Overall, the patient claims to be doing okay.  She denies having increased fatigue or any overt forms of blood loss that concern her for progressive anemia.   ? ?PHYSICAL EXAM:  ?There were no vitals taken for this visit. ?Wt Readings from Last 3 Encounters:  ?04/27/21 149 lb 3.2 oz (67.7 kg)  ?04/18/21 155 lb (70.3 kg)  ?04/13/21 159 lb 12.8 oz (72.5 kg)  ? ?There is no height or weight on file to calculate BMI. ?Performance status (ECOG): 1 - Symptomatic but completely ambulatory ?Physical Exam ?Constitutional:   ?   Appearance: Normal appearance. She is ill-appearing (chronically ill appearance).  ?   Comments: Ambulates with cane  ?HENT:  ?   Mouth/Throat:  ?   Mouth: Mucous membranes are moist.  ?   Pharynx: Oropharynx is clear. No oropharyngeal exudate or posterior oropharyngeal erythema.  ?Cardiovascular:  ?   Rate and Rhythm: Normal rate and regular rhythm.  ?   Heart sounds: No murmur heard. ?  No friction rub. No gallop.  ?Pulmonary:  ?   Effort: Pulmonary effort is normal. No respiratory distress.  ?   Breath sounds: Normal breath sounds. No wheezing,  rhonchi or rales.  ?Abdominal:  ?   General: Bowel sounds are normal. There is no distension.  ?   Palpations: Abdomen is soft. There is no mass.  ?   Tenderness: There is no abdominal tenderness.  ?Musculoskeletal:     ?   General: No swelling.  ?   Right lower leg: No edema.  ?   Left lower leg: No edema.  ?Lymphadenopathy:  ?   Cervical: No cervical adenopathy.  ?   Upper Body:  ?   Right upper body: No supraclavicular or axillary adenopathy.  ?   Left upper body: No supraclavicular or axillary adenopathy.  ?   Lower Body: No right inguinal adenopathy. No left inguinal adenopathy.  ?Skin: ?   General: Skin is warm.  ?   Coloration: Skin is not jaundiced.  ?   Findings: No lesion or rash.  ?Neurological:  ?   General: No focal deficit present.  ?   Mental Status: She is alert and oriented to person, place, and time. Mental status is at baseline.  ?Psychiatric:     ?   Mood and Affect: Mood normal.     ?   Behavior: Behavior normal.     ?   Thought Content: Thought content normal.  ? ?LABS:  ? ? ? ?  Latest Ref Rng & Units 04/20/2021  ?  4:15 AM 04/19/2021  ?  6:00 AM  04/18/2021  ?  4:44 PM  ?CMP  ?Glucose 70 - 99 mg/dL 77   91   106    ?BUN 8 - 23 mg/dL <5   <5   <5    ?Creatinine 0.44 - 1.00 mg/dL 0.80   0.84   0.91    ?Sodium 135 - 145 mmol/L 139   140   136    ?Potassium 3.5 - 5.1 mmol/L 5.3   3.0   3.3    ?Chloride 98 - 111 mmol/L 109   109   105    ?CO2 22 - 32 mmol/L 26   23   19     ?Calcium 8.9 - 10.3 mg/dL 8.8   7.8   8.7    ?Total Protein 6.5 - 8.1 g/dL  4.2   5.6    ?Total Bilirubin 0.3 - 1.2 mg/dL  0.8   1.8    ?Alkaline Phos 38 - 126 U/L  91   123    ?AST 15 - 41 U/L  27   43    ?ALT 0 - 44 U/L  20   26    ? ?Iron 28 - 170 ug/dL 71   ?TIBC 250 - 450 ug/dL 141 Low    ?Saturation Ratios 10.4 - 31.8 % 50 High    ?UIBC ug/dL 70   ? ?Ferritin 11 - 307 ng/mL 363 High    ? ?Transferrin Receptor 12.2 - 27.3 nmol/L 23.2   ? ?Vitamin B-12 180 - 914 pg/mL >7,500 High    ? ?Folate >5.9 ng/mL 22.6  ? ?TSH 0.350 -  4.500 uIU/mL 1.070   ? ?Total Protein ELP 6.0 - 8.5 g/dL 5.4 Low    ?Albumin ELP 2.9 - 4.4 g/dL 2.7 Low    ?Alpha-1-Globulin 0.0 - 0.4 g/dL 0.2   ?Alpha-2-Globulin 0.4 - 1.0 g/dL 0.6   ?Beta Globulin 0.7 - 1.3 g/dL 0.7   ?Gamma Globulin 0.4 - 1.8 g/dL 1.2   ?M-Spike, % Not Observed g/dL Not Observed   ? ?Globulin, Total 2.2 - 3.9 g/dL 2.7 VC   ?A/G Ratio 0.7 - 1.7 1.0 VC   ? ?ASSESSMENT & PLAN:  ?A 66 y.o. female with macrocytic anemia.  When reviewing all of her recent labs, she does not have any nutritional deficiencies, thyroid disease, kidney disease, or multiple myeloma factoring into her anemia.  The patient understands that her hemoglobin is now low normal.  Her white cells and platelets, although low, are not dangerously low to where her cytopenias should impact her daily quality of life.  For now, her peripheral counts will be followed conservatively.  I will see her back in 6 months for repeat clinical assessment.  The patient understands all the plans discussed today and is in agreement with them. ? ?I, Rita Ohara, am acting as scribe for Marice Potter, MD   ? ?I have reviewed this report as typed by the medical scribe, and it is complete and accurate. ? ?Zianne Schubring Macarthur Critchley, MD ? ? ? ?  ? ?
